# Patient Record
Sex: Female | Born: 1970 | Race: White | Hispanic: No | Smoking: Never smoker
Health system: Southern US, Community
[De-identification: ages and names within clinical notes are randomized; demographics above are authoritative.]

## PROBLEM LIST (undated history)

## (undated) ENCOUNTER — Inpatient Hospital Stay (HOSPITAL_COMMUNITY): Payer: Self-pay

## (undated) DIAGNOSIS — G40909 Epilepsy, unspecified, not intractable, without status epilepticus: Secondary | ICD-10-CM

## (undated) DIAGNOSIS — E282 Polycystic ovarian syndrome: Secondary | ICD-10-CM

## (undated) DIAGNOSIS — F419 Anxiety disorder, unspecified: Secondary | ICD-10-CM

## (undated) DIAGNOSIS — R569 Unspecified convulsions: Secondary | ICD-10-CM

## (undated) DIAGNOSIS — I10 Essential (primary) hypertension: Secondary | ICD-10-CM

---

## 2000-07-07 ENCOUNTER — Inpatient Hospital Stay (HOSPITAL_COMMUNITY): Admission: EM | Admit: 2000-07-07 | Discharge: 2000-07-11 | Payer: Self-pay | Admitting: *Deleted

## 2000-10-09 ENCOUNTER — Other Ambulatory Visit: Admission: RE | Admit: 2000-10-09 | Discharge: 2000-10-09 | Payer: Self-pay | Admitting: Gynecology

## 2000-10-09 ENCOUNTER — Encounter (INDEPENDENT_AMBULATORY_CARE_PROVIDER_SITE_OTHER): Payer: Self-pay

## 2004-07-06 ENCOUNTER — Emergency Department (HOSPITAL_COMMUNITY): Admission: EM | Admit: 2004-07-06 | Discharge: 2004-07-07 | Payer: Self-pay | Admitting: Emergency Medicine

## 2006-04-11 ENCOUNTER — Inpatient Hospital Stay (HOSPITAL_COMMUNITY): Admission: AD | Admit: 2006-04-11 | Discharge: 2006-04-11 | Payer: Self-pay | Admitting: Family Medicine

## 2006-04-13 ENCOUNTER — Inpatient Hospital Stay (HOSPITAL_COMMUNITY): Admission: AD | Admit: 2006-04-13 | Discharge: 2006-04-13 | Payer: Self-pay | Admitting: Family Medicine

## 2006-04-18 ENCOUNTER — Inpatient Hospital Stay (HOSPITAL_COMMUNITY): Admission: AD | Admit: 2006-04-18 | Discharge: 2006-04-18 | Payer: Self-pay | Admitting: Obstetrics and Gynecology

## 2006-04-25 ENCOUNTER — Inpatient Hospital Stay (HOSPITAL_COMMUNITY): Admission: RE | Admit: 2006-04-25 | Discharge: 2006-04-25 | Payer: Self-pay | Admitting: Obstetrics and Gynecology

## 2006-07-27 ENCOUNTER — Encounter: Admission: RE | Admit: 2006-07-27 | Discharge: 2006-10-25 | Payer: Self-pay | Admitting: Obstetrics and Gynecology

## 2006-08-27 ENCOUNTER — Inpatient Hospital Stay (HOSPITAL_COMMUNITY): Admission: AD | Admit: 2006-08-27 | Discharge: 2006-08-27 | Payer: Self-pay | Admitting: Obstetrics and Gynecology

## 2006-11-13 ENCOUNTER — Inpatient Hospital Stay (HOSPITAL_COMMUNITY): Admission: AD | Admit: 2006-11-13 | Discharge: 2006-11-13 | Payer: Self-pay | Admitting: Obstetrics and Gynecology

## 2006-11-14 ENCOUNTER — Inpatient Hospital Stay (HOSPITAL_COMMUNITY): Admission: AD | Admit: 2006-11-14 | Discharge: 2006-11-14 | Payer: Self-pay | Admitting: Obstetrics & Gynecology

## 2006-11-21 ENCOUNTER — Inpatient Hospital Stay (HOSPITAL_COMMUNITY): Admission: RE | Admit: 2006-11-21 | Discharge: 2006-11-27 | Payer: Self-pay | Admitting: Obstetrics and Gynecology

## 2006-11-23 ENCOUNTER — Encounter (INDEPENDENT_AMBULATORY_CARE_PROVIDER_SITE_OTHER): Payer: Self-pay | Admitting: Specialist

## 2006-11-26 ENCOUNTER — Ambulatory Visit: Payer: Self-pay | Admitting: Psychiatry

## 2008-09-29 ENCOUNTER — Inpatient Hospital Stay (HOSPITAL_COMMUNITY): Admission: AD | Admit: 2008-09-29 | Discharge: 2008-09-29 | Payer: Self-pay | Admitting: Obstetrics and Gynecology

## 2009-01-19 ENCOUNTER — Ambulatory Visit (HOSPITAL_COMMUNITY): Admission: RE | Admit: 2009-01-19 | Discharge: 2009-01-19 | Payer: Self-pay | Admitting: Obstetrics and Gynecology

## 2009-01-21 ENCOUNTER — Inpatient Hospital Stay (HOSPITAL_COMMUNITY): Admission: AD | Admit: 2009-01-21 | Discharge: 2009-01-21 | Payer: Self-pay | Admitting: Obstetrics and Gynecology

## 2009-02-20 ENCOUNTER — Ambulatory Visit (HOSPITAL_COMMUNITY): Admission: RE | Admit: 2009-02-20 | Discharge: 2009-02-20 | Payer: Self-pay | Admitting: Obstetrics and Gynecology

## 2009-03-17 ENCOUNTER — Ambulatory Visit (HOSPITAL_COMMUNITY): Admission: RE | Admit: 2009-03-17 | Discharge: 2009-03-17 | Payer: Self-pay | Admitting: Obstetrics and Gynecology

## 2009-04-06 ENCOUNTER — Encounter: Admission: RE | Admit: 2009-04-06 | Discharge: 2009-07-05 | Payer: Self-pay | Admitting: Obstetrics and Gynecology

## 2009-04-07 ENCOUNTER — Ambulatory Visit (HOSPITAL_COMMUNITY): Admission: RE | Admit: 2009-04-07 | Discharge: 2009-04-07 | Payer: Self-pay | Admitting: Obstetrics and Gynecology

## 2009-04-10 ENCOUNTER — Ambulatory Visit: Payer: Self-pay | Admitting: Obstetrics & Gynecology

## 2009-04-28 ENCOUNTER — Inpatient Hospital Stay (HOSPITAL_COMMUNITY): Admission: AD | Admit: 2009-04-28 | Discharge: 2009-04-28 | Payer: Self-pay | Admitting: Obstetrics and Gynecology

## 2009-04-30 ENCOUNTER — Ambulatory Visit (HOSPITAL_COMMUNITY): Admission: RE | Admit: 2009-04-30 | Discharge: 2009-04-30 | Payer: Self-pay | Admitting: Obstetrics and Gynecology

## 2009-05-19 ENCOUNTER — Inpatient Hospital Stay (HOSPITAL_COMMUNITY): Admission: RE | Admit: 2009-05-19 | Discharge: 2009-05-21 | Payer: Self-pay | Admitting: Obstetrics and Gynecology

## 2009-05-19 ENCOUNTER — Encounter (INDEPENDENT_AMBULATORY_CARE_PROVIDER_SITE_OTHER): Payer: Self-pay | Admitting: Obstetrics and Gynecology

## 2009-06-05 ENCOUNTER — Ambulatory Visit: Admission: RE | Admit: 2009-06-05 | Discharge: 2009-06-05 | Payer: Self-pay | Admitting: Obstetrics and Gynecology

## 2010-01-14 IMAGING — US US OB FOLLOW-UP
1 series · 14 of 28 positions shown · non-contrast
Comparison: none

OBSTETRICAL ULTRASOUND:
 This ultrasound was performed in The [HOSPITAL], and the [facility] report will be stored to [REDACTED] PACS.

[Series 1: us ob follow-up · 14 of 28 slices shown]
[im 2/28]
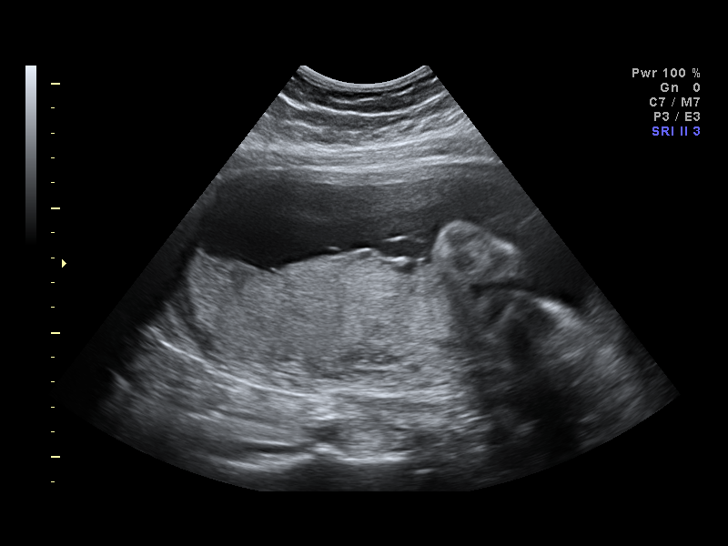
[im 4/28]
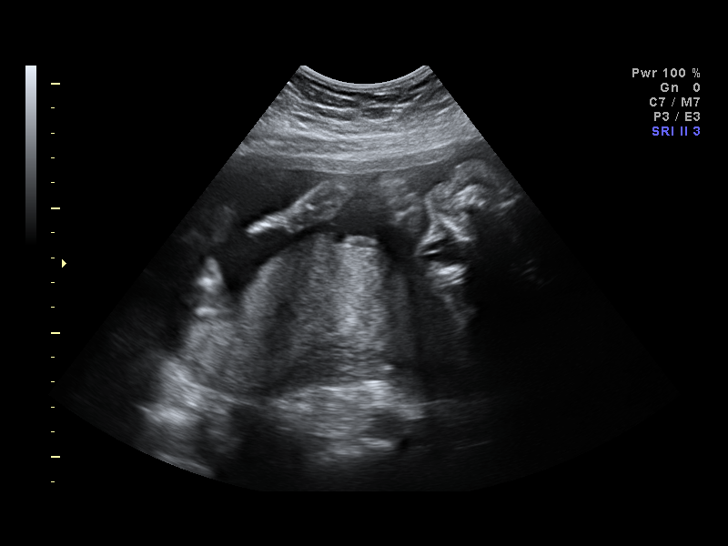
[im 6/28]
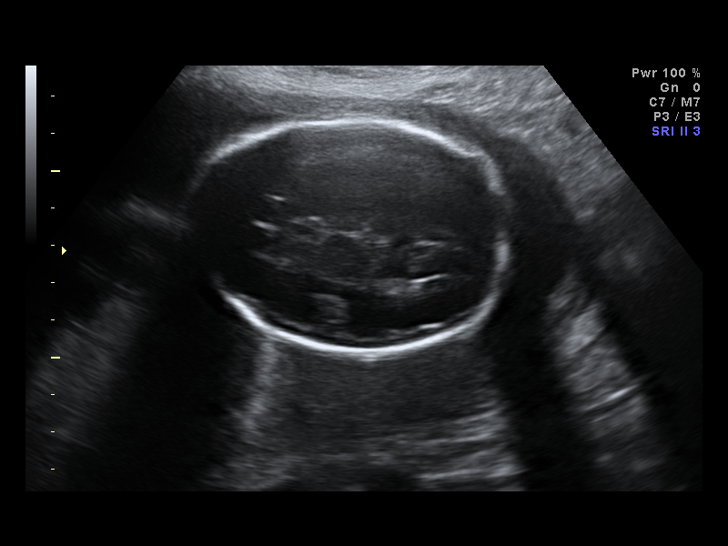
[im 8/28]
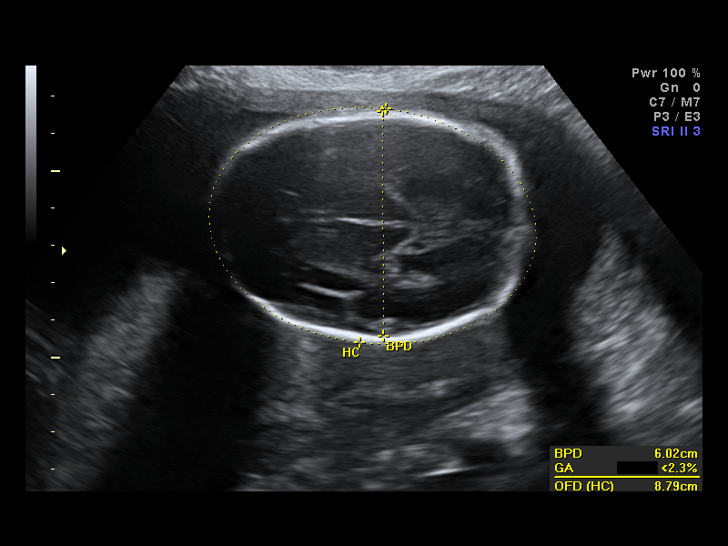
[im 10/28]
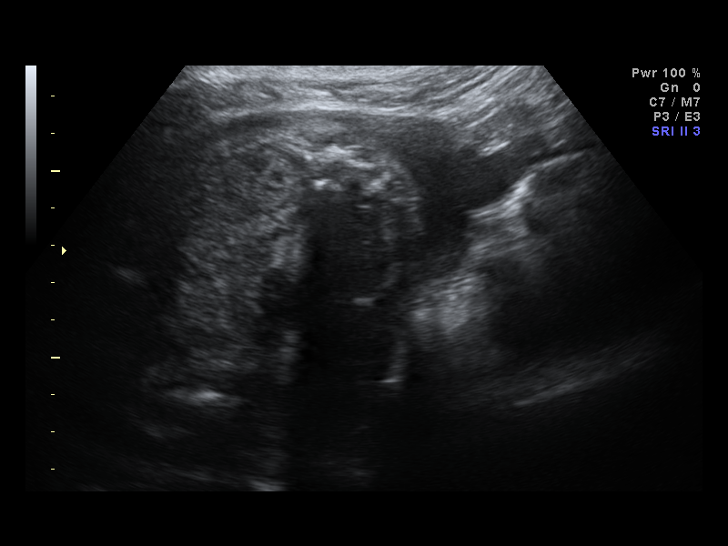
[im 12/28]
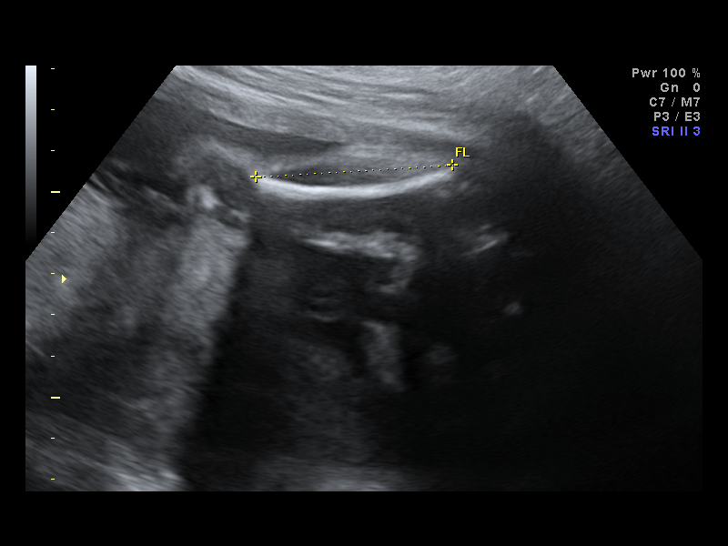
[im 14/28]
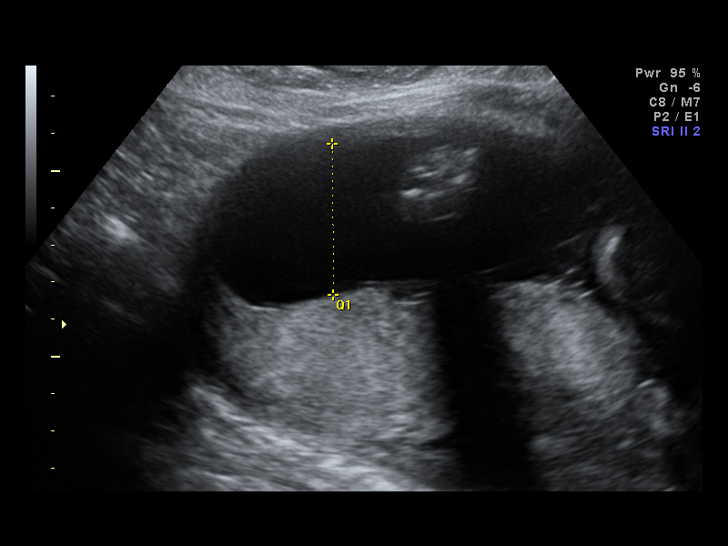
[im 16/28]
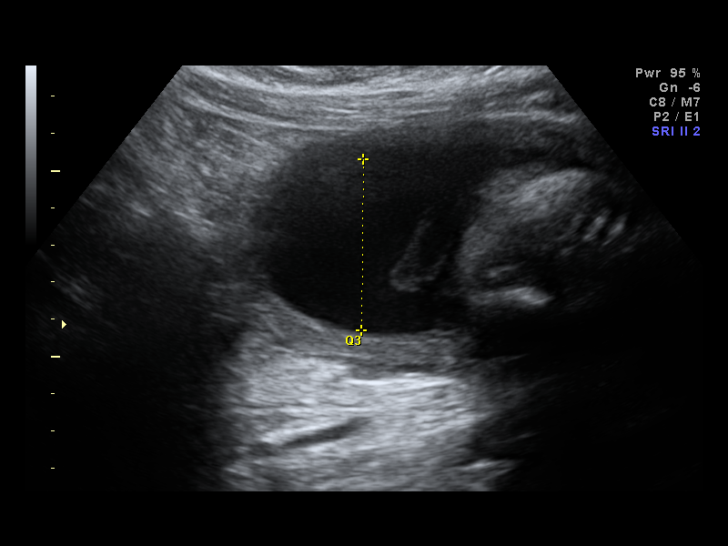
[im 18/28]
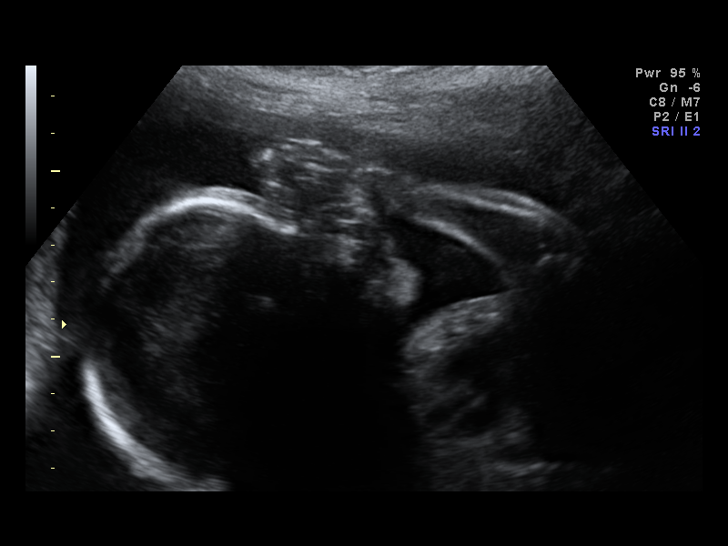
[im 20/28]
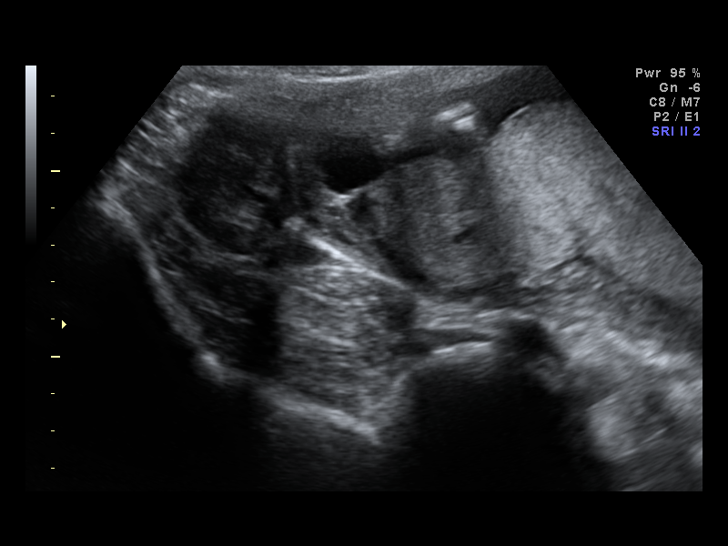
[im 22/28]
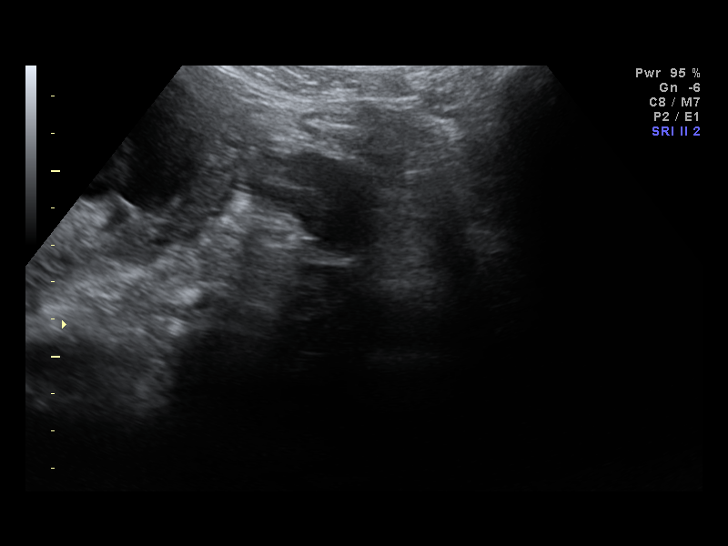
[im 24/28]
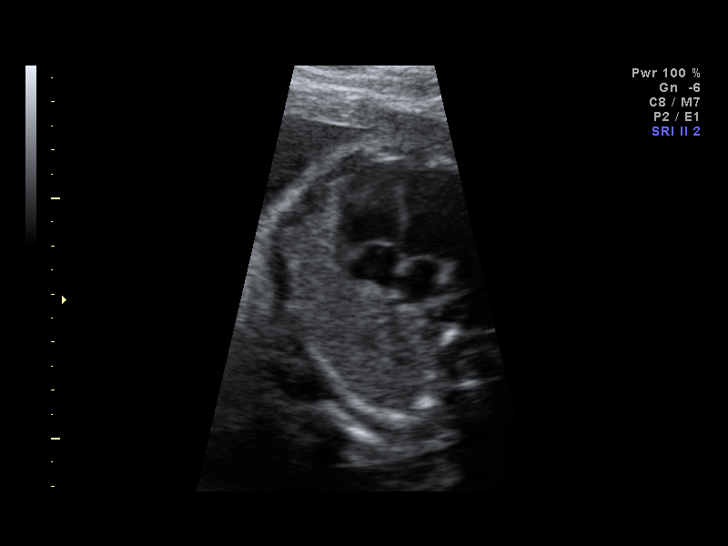
[im 26/28]
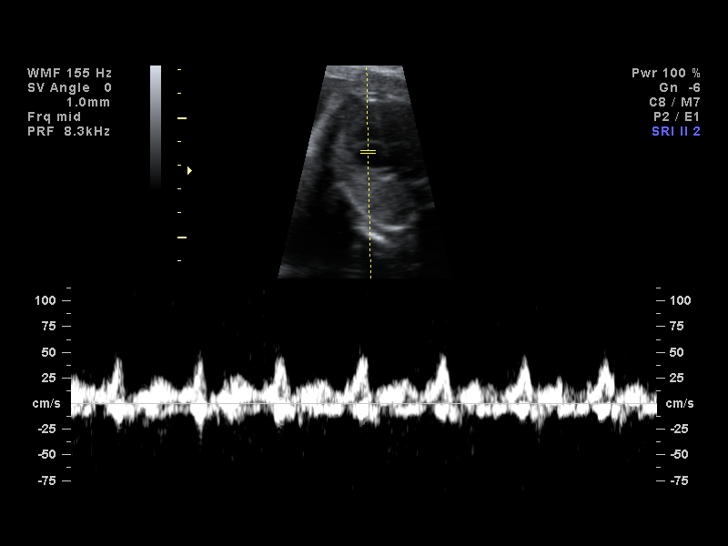
[im 28/28]
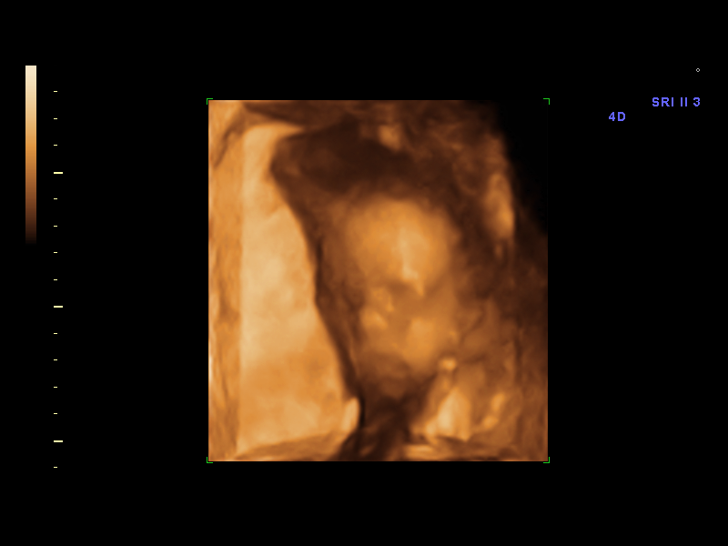

[14 of 28 positions shown; findings below may reference images not displayed]

IMPRESSION: [facility] has also been faxed to the ordering physician.

## 2010-12-17 LAB — GLUCOSE, CAPILLARY
Glucose-Capillary: 113 mg/dL — ABNORMAL HIGH (ref 70–99)
Glucose-Capillary: 113 mg/dL — ABNORMAL HIGH (ref 70–99)
Glucose-Capillary: 113 mg/dL — ABNORMAL HIGH (ref 70–99)
Glucose-Capillary: 113 mg/dL — ABNORMAL HIGH (ref 70–99)
Glucose-Capillary: 113 mg/dL — ABNORMAL HIGH (ref 70–99)
Glucose-Capillary: 113 mg/dL — ABNORMAL HIGH (ref 70–99)
Glucose-Capillary: 113 mg/dL — ABNORMAL HIGH (ref 70–99)
Glucose-Capillary: 113 mg/dL — ABNORMAL HIGH (ref 70–99)
Glucose-Capillary: 113 mg/dL — ABNORMAL HIGH (ref 70–99)
Glucose-Capillary: 113 mg/dL — ABNORMAL HIGH (ref 70–99)
Glucose-Capillary: 113 mg/dL — ABNORMAL HIGH (ref 70–99)
Glucose-Capillary: 113 mg/dL — ABNORMAL HIGH (ref 70–99)
Glucose-Capillary: 113 mg/dL — ABNORMAL HIGH (ref 70–99)
Glucose-Capillary: 113 mg/dL — ABNORMAL HIGH (ref 70–99)
Glucose-Capillary: 113 mg/dL — ABNORMAL HIGH (ref 70–99)
Glucose-Capillary: 113 mg/dL — ABNORMAL HIGH (ref 70–99)
Glucose-Capillary: 113 mg/dL — ABNORMAL HIGH (ref 70–99)
Glucose-Capillary: 113 mg/dL — ABNORMAL HIGH (ref 70–99)
Glucose-Capillary: 113 mg/dL — ABNORMAL HIGH (ref 70–99)
Glucose-Capillary: 113 mg/dL — ABNORMAL HIGH (ref 70–99)
Glucose-Capillary: 113 mg/dL — ABNORMAL HIGH (ref 70–99)
Glucose-Capillary: 113 mg/dL — ABNORMAL HIGH (ref 70–99)
Glucose-Capillary: 113 mg/dL — ABNORMAL HIGH (ref 70–99)
Glucose-Capillary: 113 mg/dL — ABNORMAL HIGH (ref 70–99)
Glucose-Capillary: 113 mg/dL — ABNORMAL HIGH (ref 70–99)
Glucose-Capillary: 113 mg/dL — ABNORMAL HIGH (ref 70–99)
Glucose-Capillary: 113 mg/dL — ABNORMAL HIGH (ref 70–99)
Glucose-Capillary: 113 mg/dL — ABNORMAL HIGH (ref 70–99)
Glucose-Capillary: 113 mg/dL — ABNORMAL HIGH (ref 70–99)
Glucose-Capillary: 113 mg/dL — ABNORMAL HIGH (ref 70–99)
Glucose-Capillary: 113 mg/dL — ABNORMAL HIGH (ref 70–99)
Glucose-Capillary: 113 mg/dL — ABNORMAL HIGH (ref 70–99)
Glucose-Capillary: 113 mg/dL — ABNORMAL HIGH (ref 70–99)
Glucose-Capillary: 113 mg/dL — ABNORMAL HIGH (ref 70–99)
Glucose-Capillary: 113 mg/dL — ABNORMAL HIGH (ref 70–99)
Glucose-Capillary: 113 mg/dL — ABNORMAL HIGH (ref 70–99)
Glucose-Capillary: 113 mg/dL — ABNORMAL HIGH (ref 70–99)
Glucose-Capillary: 113 mg/dL — ABNORMAL HIGH (ref 70–99)
Glucose-Capillary: 113 mg/dL — ABNORMAL HIGH (ref 70–99)
Glucose-Capillary: 113 mg/dL — ABNORMAL HIGH (ref 70–99)
Glucose-Capillary: 113 mg/dL — ABNORMAL HIGH (ref 70–99)
Glucose-Capillary: 113 mg/dL — ABNORMAL HIGH (ref 70–99)
Glucose-Capillary: 113 mg/dL — ABNORMAL HIGH (ref 70–99)
Glucose-Capillary: 113 mg/dL — ABNORMAL HIGH (ref 70–99)
Glucose-Capillary: 113 mg/dL — ABNORMAL HIGH (ref 70–99)
Glucose-Capillary: 113 mg/dL — ABNORMAL HIGH (ref 70–99)
Glucose-Capillary: 113 mg/dL — ABNORMAL HIGH (ref 70–99)
Glucose-Capillary: 113 mg/dL — ABNORMAL HIGH (ref 70–99)
Glucose-Capillary: 113 mg/dL — ABNORMAL HIGH (ref 70–99)
Glucose-Capillary: 113 mg/dL — ABNORMAL HIGH (ref 70–99)
Glucose-Capillary: 113 mg/dL — ABNORMAL HIGH (ref 70–99)
Glucose-Capillary: 113 mg/dL — ABNORMAL HIGH (ref 70–99)
Glucose-Capillary: 113 mg/dL — ABNORMAL HIGH (ref 70–99)
Glucose-Capillary: 113 mg/dL — ABNORMAL HIGH (ref 70–99)
Glucose-Capillary: 113 mg/dL — ABNORMAL HIGH (ref 70–99)
Glucose-Capillary: 113 mg/dL — ABNORMAL HIGH (ref 70–99)
Glucose-Capillary: 113 mg/dL — ABNORMAL HIGH (ref 70–99)
Glucose-Capillary: 113 mg/dL — ABNORMAL HIGH (ref 70–99)
Glucose-Capillary: 113 mg/dL — ABNORMAL HIGH (ref 70–99)
Glucose-Capillary: 113 mg/dL — ABNORMAL HIGH (ref 70–99)
Glucose-Capillary: 113 mg/dL — ABNORMAL HIGH (ref 70–99)
Glucose-Capillary: 113 mg/dL — ABNORMAL HIGH (ref 70–99)
Glucose-Capillary: 113 mg/dL — ABNORMAL HIGH (ref 70–99)
Glucose-Capillary: 113 mg/dL — ABNORMAL HIGH (ref 70–99)
Glucose-Capillary: 64 mg/dL — ABNORMAL LOW (ref 70–99)
Glucose-Capillary: 68 mg/dL — ABNORMAL LOW (ref 70–99)
Glucose-Capillary: 70 mg/dL (ref 70–99)
Glucose-Capillary: 85 mg/dL (ref 70–99)
Glucose-Capillary: 88 mg/dL (ref 70–99)

## 2010-12-17 LAB — CBC
HCT: 29.6 % — ABNORMAL LOW (ref 36.0–46.0)
Hemoglobin: 10.4 g/dL — ABNORMAL LOW (ref 12.0–15.0)
MCHC: 35 g/dL (ref 30.0–36.0)
MCV: 92.4 fL (ref 78.0–100.0)
Platelets: 113 10*3/uL — ABNORMAL LOW (ref 150–400)
RBC: 3.21 MIL/uL — ABNORMAL LOW (ref 3.87–5.11)
RDW: 13.1 % (ref 11.5–15.5)
WBC: 7.9 10*3/uL (ref 4.0–10.5)

## 2010-12-17 LAB — CCBB MATERNAL DONOR DRAW

## 2010-12-18 LAB — CBC
MCHC: 34.1 g/dL (ref 30.0–36.0)
Platelets: 172 10*3/uL (ref 150–400)
RDW: 13.4 % (ref 11.5–15.5)

## 2010-12-18 LAB — BASIC METABOLIC PANEL
BUN: 9 mg/dL (ref 6–23)
CO2: 25 mEq/L (ref 19–32)
Calcium: 9 mg/dL (ref 8.4–10.5)
Creatinine, Ser: 1 mg/dL (ref 0.4–1.2)
GFR calc non Af Amer: >60 mL/min (ref >60)
Glucose, Bld: 99 mg/dL (ref 70–99)

## 2010-12-18 LAB — URINE CULTURE: Colony Count: 4000

## 2010-12-18 LAB — RPR: RPR Ser Ql: NONREACTIVE

## 2010-12-18 LAB — URINALYSIS, ROUTINE W REFLEX MICROSCOPIC
Glucose, UA: NEGATIVE mg/dL
Hgb urine dipstick: NEGATIVE
Ketones, ur: 15 mg/dL — AB
Protein, ur: NEGATIVE mg/dL
Urobilinogen, UA: 1 mg/dL (ref 0.0–1.0)

## 2010-12-18 LAB — URINE MICROSCOPIC-ADD ON

## 2010-12-27 LAB — POCT PREGNANCY, URINE: Preg Test, Ur: POSITIVE

## 2011-01-25 NOTE — Letter (Signed)
Jan 19, 2009    Guy Sandifer. Henderson Cloud, MD  [address]  [place] [address]   RE:   SHALOM, WARE  MR#   [id number]  ACC#        [id number]   MFM CONSULTATION REPORT   Dear Dr. Henderson Cloud:   I first want to thank you for referring your patient Kathleen Blake  to me for a perinatal consult.  As you know she is a 40 year old gravida  3, para 1, who presents with multiple high risk factors.  Her current  EDC is May 24, 2009, which places her at 53 and 6/7th weeks today.  Her pregnancy is complicated by the following:  1.  Maternal history of epilepsy.  2.  Chronic hypertension.  3.  Previous cesarean section.  4.  History of gestational diabetes.  5.  Advanced maternal age.   I will address perinatal management of each of these high risk factors  as outlined below.  1.  History of seizure disorder:  The patient reports a history of  epilepsy dating back to the age of 58.  At which time, she was diagnosed  with grand mal seizures.  She initially was placed on phenobarbital, but  has been on Tegretol since approximately 1991.  The patient reports no  significant grand mal seizures since 2003, but does report a petit mal  seizure approximately every 4 months.  She was followed throughout her  last pregnancy, at which time she delivered in 2008 on Tegretol.  Finally she reports the seizure disorder is secondary to a head injury  which she suffered as a child.  I discussed with her that the majority  of women will not experience an increase in seizure frequency during the  pregnancy and that the perineal outcomes in general are quite good with  epilepsy-complicating pregnancy.  To the fact that she has not had a  grand mal seizures in 2003 and did not report increased seizure  frequency during her last pregnancy, I would not anticipate that would  be a major problem with this pregnancy.  I did discuss that there is  approximately 1% risk of spina bifida with  Tegretol use.  In addition,  there have been some reports of genitourinary malformations in infants  born to moms' on Tegretol monotherapy.  We performed a complete anatomic  survey today and there was no evidence of spina bifida or renal  abnormalities noted.  In addition, the fetal growth was noted to be  normal.  This ultrasound report will come as a separate report to you,  separate from the consultation letter.  It is also of note is that her  AFP test was noted to be normal during the pregnancy.  2.  Chronic hypertension, on labetalol.  The patient reports that she  has been diagnosed with chronic hypertension, but mainly due to when she  has been significantly overweight and during her pregnancy.  Of note is  that her blood pressure on today's visit was 140/101 with a few recheck  of 140/92.  She reports that she forgot to take her labetalol today and  this does appear to be somewhat noncompliant with her labetalol therapy.  Due to her history of chronic hypertension, I would recommend a baseline  24-hour urine for protein, however, we will also need serial scans for  growth in the initiation of NST starting at 32 weeks' gestation.  3.  History of previous cesarean section.  The  patient states that you  have already discussed with her attempt of VBAC and she states that she  is interested in attempting a VBAC in this pregnancy.  4.  History of gestational diabetes.  Due to the patient's history of  gestational diabetes in her previous pregnancy and her relative obesity  (weight 211 pounds), I would recommend an early Glucola screen in this  pregnancy.  If this Glucola screen is normal, then I would recommend  repeating it at 27-28 weeks' gestation.  If that screen is also within  normal limits, then I would accept that she does not have gestational  diabetes during this pregnancy.  5.  Advanced maternal age.  Of note is that the patient had a normal  first trimester screen during  this pregnancy.  I did discuss the  increased risk of karyotype abnormalities.  However, the patient again  reaffirmed that she is not interested in any kind of definitive testing.  Although some recommend NSTs, so with her advanced maternal age it is  49.  I do not think these are necessary.  However, based upon her  chronic hypertension, she will be obtaining NSTs starting at 32 weeks'  gestation.   I again want to thank you for referring your patient, Kathleen Blake  to me for a perinatal consultation, although I am aware that the main  quantitative question was the use of Tegretol during the pregnancy.  I  did address with her, her other risk factors.  If you have any questions  or concerns concerning my recommendations, please feel free to contact  me.   Sincerely,      Felipa Eth, MD      DCM/MEDQ  D:  01/19/2009  T:  01/20/2009  Job:  [id number]

## 2011-01-28 NOTE — Consult Note (Signed)
[name], [name]           ACCOUNT NO.:  [id number]   MEDICAL RECORD NO.:  [id number]          PATIENT TYPE:  INP   LOCATION:  9105                          FACILITY:  [facility]   PHYSICIAN:  Antonietta Breach, M.D.  DATE OF BIRTH:  28-Aug-1971   DATE OF CONSULTATION:  11/27/2006  DATE OF DISCHARGE:  11/27/2006                                 CONSULTATION   REASON FOR CONSULTATION:  Depression.   REQUESTING PHYSICIAN:  Guy Sandifer. Henderson Cloud, M.D.   HISTORY OF PRESENT ILLNESS:  Kathleen Blake is a 40 year old female  admitted to the [facility] on November 21, 2006.  She  underwent a low transverse cesarean section on November 23, 2006, due to  failed induction.  She also was experiencing gestational diabetes as  well as chronic hypertension.   The baby did require a period of time in the ICU and this news was very  distressing to the patient.   She was crying and expressing some depressed mood.  She was not having  any suicidal thoughts.  She also has had no racing thoughts or increased  energy.  She has had no thoughts of harming others, hallucinations or  delusions.   The patient has had depressed mood during pregnancy with decreased  energy.  She has solid hope today after getting news of a positive  prognosis for her baby and a likely discharge of her baby from the  hospital this coming week.  The patient is socially appropriate and  cooperative with care.  She is exhibiting good judgment.  She is not  agitated or combative.   PAST PSYCHIATRIC HISTORY:  Kathleen Blake describes history of manic  symptoms, the last episode being in 2006.  She did have a number of days  of increased energy, decreased need for sleep, racing thoughts,  pressured speech, and excessive spending.  She has never made any  suicide attempts.  She does have a history of depression.   She has had an excellent response in mood stabilization with Tegretol.  The dosage has only been required at  200 mg a day, which also has been  successful for her antiseizure treatment.  This dosage has given her an  adequate antiseizure blood level.   She denies history of hallucinations or delusions.   FAMILY PSYCHIATRIC HISTORY:  None known.   SOCIAL HISTORY:  She is divorced but has a very supportive boyfriend.  She does not use any alcohol or illegal drugs.  Currently, she is  unemployed.   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   GENERAL MEDICAL PROBLEMS:  1. Hypertension.  2. Gestational diabetes.  3. Status post cesarean section.   MEDICATIONS:  The MAR is reviewed.  The patient has currently been  started on Zoloft 25 mg q.a.m.  Her Tegretol is at 100 mg daily.  She  has allergies to [facility], specifically SHRIMP.   LABORATORY DATA:  WBC 7.8, hemoglobin 10.1, platelet count 163.  Metabolic panel:  BUN 9, creatinine 1.0, SGOT 19, SGPT 14, albumin is  1.9, calcium 8.3.  Her Tegretol level was low at 3.2.  RPR was  nonreactive.  REVIEW OF SYSTEMS:  CONSTITUTIONAL:  Afebrile.  HEAD:  No trauma.  EYES:  No visual changes.  EARS:  No hearing impairment.  NOSE:  No rhinorrhea.  MOUTH/THROAT:  No sore throat.  NEUROLOGIC:  Unremarkable.  There are no  current seizures.  She does have a history of seizures, however.  PSYCHIATRIC:  As above.  CARDIOVASCULAR:  No chest pain, palpitations.  RESPIRATORY:  No coughing or wheezing.  GASTROINTESTINAL:  No nausea or  vomiting.  GENITOURINARY:  No dysuria.  SKIN:  Unremarkable.  HEMATOLOGIC/LYMPHATIC:  Unremarkable.  MUSCULOSKELETAL:  Unremarkable.  ENDOCRINE/METABOLIC:  As above.   EXAMINATION:  Vital signs:  Temperature 97.7, pulse 77, respirations 18,  blood pressure 108/72.   MENTAL STATUS EXAMINATION:  Kathleen Blake is an alert female appearing  her chronologic age, sitting up in a hospital chair with normal posture  and normal psychomotor tone.  Her eye contact is good.  She is socially  appropriate and cooperative.  She is oriented to all spheres.   Her  memory is intact to immediate, recent and remote.  Her speech involves  rate and prosody.  Her fund of knowledge and intelligence are within  normal limits.  Thought process logical, coherent, goal-directed.  No  looseness of association.  Thought content:  No thoughts of harming  herself, no thought of harming others.  No delusions, no hallucinations.  Concentration within normal limits.  Affect is slightly constricted at  baseline but then with a broad and appropriate range as the interview  progresses.  Her mood is within normal limits.  Insight is good,  judgment is intact.  She is well groomed.  The patient has not had any  thoughts of harming her baby.   ASSESSMENT:  AXIS I:  1. Code 293.83, mood disorder not otherwise specified (functional and      general medical factors).  2. Code 296.80, bipolar disorder not otherwise specified.  The patient      does have a history of typical manic episodes and is currently in a      partial remission of a depressive phase.  AXIS II:  Deferred.  AXIS III:  See general medical problems.  AXIS IV:  General medical.  AXIS V:  55.   Kathleen Blake is not at risk to harm herself or others.  She agrees to  use emergency services immediately for any thoughts of harming herself,  thoughts of harming others, thoughts of harming the baby, or other  emergency psychiatric symptoms.   The undersigned provided education and ego support.  The indications,  alternatives and adverse effects of the following were discussed with  the patient:  Tegretol for mood stabilization as well as antiseizure  including the risk of potentially lethal liver and blood effect; Zoloft  for antidepressant; the discussion also included the fact that there  could be unknown harmful effects to the baby through breast milk as well  as the fact that there is relatively low data in the literature on the relationship between breast-feeding women on these medications and   effects for the baby.  The patient understands this above information  and wants to proceed with Tegretol and Zoloft.  She also is motivated  for outpatient psychiatric management.   RECOMMENDATIONS:  1. Would increase her Tegretol to 200 mg q.h.s. and repeat a blood      level in 7-10 days.  Would check a CBC and liver function panel to      monitor for  Tegretol side effects.  2. Concur with the Zoloft 25 mg q.a.m., increasing to 50 mg q.a.m. as      tolerated.  3. Would ask the case manager to set this patient with psychiatric      followup at one of the psychiatric clinics attached to [facility]      or [facility].  4. Would ask that the OB/GYN outpatient physician initially monitor      the Tegretol as described above.  However, in addition to      psychiatric followup, would ask the case manager to set this      patient up with neurologic followup as well.      Antonietta Breach, M.D.  Electronically Signed     JW/MEDQ  D:  11/30/2006  T:  11/30/2006  Job:  [id number]

## 2011-01-28 NOTE — Discharge Summary (Signed)
Kathleen Blake, Kathleen Blake           ACCOUNT NO.:  [id number]   MEDICAL RECORD NO.:  [id number]          PATIENT TYPE:  INP   LOCATION:  9105                          [facility]ACILITY:  [facility]   PHYSICIAN:  Dineen Kid. Rana Snare, M.D.    DATE OF BIRTH:  1971-01-02   DATE OF ADMISSION:  11/21/2006  DATE OF DISCHARGE:  11/27/2006                               DISCHARGE SUMMARY   ADMITTING DIAGNOSES:  1. Intrauterine pregnancy at 38-3/7 weeks' estimated gestational age.  2. Chronic hypertension.  3. History of bipolar and seizure disorder.  4. Gestational diabetes.  5. Induction of labor.   DISCHARGE DIAGNOSES:  1. Status post low transverse cesarean section secondary to failed      induction.  2. Viable female infant.   PROCEDURE:  Primary low transverse cesarean section.   REASON FOR ADMISSION:  Please see written H&P.   HOSPITAL COURSE:  The patient is a 40 year old white married female  gravida 2, para 0 that was admitted to [facility] at 75-  3/7 weeks estimated gestational age for induction of labor.  The  patient's pregnancy had been complicated by chronic hypertension,  history of bipolar and seizure disorder, and gestational diabetes.  The  patient had been on Glyburide with good control of her sugars.  The  patient had undergone an amniocentesis 7-9 days prior to admission for  lung maturity which had revealed an L/S ratio of 1.3 to 1, negative for  PG.  The patient was now admitted for induction of labor.   MEDICATIONS ON ADMISSION:  1. Labetalol 200 mg daily.  2. Tegretol 100 mg daily.   Vital signs were normal.  She was afebrile.  Cervix was noted to be  closed, thick with vertex high in the pelvis.  Fetal heart tones were  reactive.  Uterine contraction pattern revealed some uterine  irritability.  PIH labs were drawn which were within normal limits.  The  patient was now set for a two-stage induction of labor.  On the  following day after Kathleen Blake day administration of  Pitocin, there had been no  further change in the cervix, and decision was made to consider a repeat  induction or a cesarean delivery.  The patient and husband had decided  to proceed with a primary low transverse cesarean section.  The patient  was then transferred to the operating room where spinal anesthesia was  administered without difficulty.  A low transverse incision was made  with delivery of a viable female infant weighing 7 pounds 1 ounce, Apgars  of 8 at one minute and 9 at five minutes.  The patient tolerated the  procedure well and was taken to the recovery room in stable condition.  On postoperative day #1, the patient denied headache, blurred vision or  right upper quadrant pain.  Vital signs were stable.  Blood pressure was  139/87 to 121/76.  Deep tendon reflexes 1 to 2 plus without clonus, and  small pedal edema was noted.  The patient denied any right upper  quadrant tenderness.  Abdomen soft with some decrease in bowel sounds.  Fundus was firm and  nontender.  Abdominal dressing was noted to be  clean, dry and intact.  Foley had been discontinued.  She had not voided  at the time of rounding.  Fasting blood sugar was 82 mg/dL. Laboratory  findings revealed hemoglobin of 11.4, platelet count 160,000, WBC count  of 9.7.  PIH labs were ordered for in the morning.  Baby was noted to be  stable in the NICU on nasal cannula.  On postoperative day #2, the  patient was feeling somewhat better.  She denied any CNS symptoms.  Vital signs were stable with blood pressure 141/98 to 126/91.  Lungs  were clear to auscultation.  Heart regular rate and rhythm.  Abdomen  soft, good return of bowel function.  No epigastric tenderness noted.  Abdominal dressing had been removed revealing an incision that was  clean, dry and intact.  PIH labs were within normal limits.  On  postoperative day #3, the patient was concerned about her baby.  The  patient did have history of bipolar disorder in  the past.  She denied  any homicidal or suicidal ideation.  Vital signs were stable.  Incision  was clean, dry and intact.  She continued on Tegretol, and psych consult  was ordered.  On postoperative day #4, the patient was complaining of  some tearfulness with plans for discharge.  Baby continued to be stable  in the NICU with possible discharge in approximately 2 days on baby.  Vital signs were stable; blood pressure 143/94 to 105/63.  Deep tendon  reflexes of 1+, no clonus.  Pedal edema was noted.  Abdomen soft, no  right upper quadrant tenderness.  Fundus firm and nontender.  Incision  was clean, dry and intact.  She was ambulating well.  Tegretol level was  drawn, and the patient was started on some Zoloft.  Discharge  instructions reviewed, and the patient was later discharged home.   CONDITION ON DISCHARGE:  Stable.   DIET:  Regular as tolerated.   ACTIVITY:  No heavy lifting, no driving x2 weeks, no vaginal entry.   FOLLOW UP:  Patient to follow up in the OB office in approximately 1  week for an incision check and blood pressure check.  She was also to  follow up with psychiatric consult in approximately 2 weeks.   DISCHARGE MEDICATIONS:  1. Percocet 5/325 #30 one p.o. every 4 hours as needed for pain.  2. Motrin 600 mg every 6 hours as needed.  3. Tegretol 200 mg at bedtime.  4. CitraNatal prenatal vitamins.  5. Labetalol 200 mg 1 p.o. daily.  6. Zoloft 50 mg half a tablet 1 p.o. daily for 1 week and then      increase to 50 mg daily.      Julio Sicks, N.P.      Dineen Kid Rana Snare, M.D.  Electronically Signed    CC/MEDQ  D:  12/20/2006  T:  12/20/2006  Job:  [id number]

## 2011-01-28 NOTE — Op Note (Signed)
Kathleen Blake, Kathleen Blake           ACCOUNT NO.:  [id number]   MEDICAL RECORD NO.:  [id number]          PATIENT TYPE:  INP   LOCATION:  9105                          FACILITY:  [facility]   PHYSICIAN:  Duke Salvia. Marcelle Overlie, M.D.DATE OF BIRTH:  1971/02/24   DATE OF PROCEDURE:  11/23/2006  DATE OF DISCHARGE:                               OPERATIVE REPORT   PREOPERATIVE DIAGNOSES:  1. Term intrauterine pregnancy.  2. Chronic hypertension.  3. Mature laceration.  4. Failed induction.  5. Gestational diabetes.   POSTOPERATIVE DIAGNOSIS:  1. Term intrauterine pregnancy.  2. Chronic hypertension.  3. Mature laceration.  4. Failed induction.  5. Gestational diabetes.   PROCEDURE:  Primary low transverse cesarean section.   SURGEON:  Duke Salvia. Marcelle Overlie, M.D.   ANESTHESIA:  Spinal.   COMPLICATIONS:  None.   DRAINS:  Foley catheter.   BLOOD LOSS:  750 mL.   PROCEDURE AND FINDINGS:  The patient was taken to the operating room.  After an adequate level of spinal anesthetic was obtained with the  patient in the left tilt position, the abdomen was prepped and draped in  the usual manner for sterile abdominal procedures.  Foley catheter  positioned, draining clear urine.  A Pfannenstiel incision made two  fingerbreadths above the symphysis, carried down to the fascia -- which  was incised and extended transversely.  Rectus muscle divided in the  midline.  Peritoneum entered superiorly without incident and extended in  a vertical fashion.  The vesicouterine serosa was then incised, and the  bladder was bluntly and sharply dissected off of the lower uterine  segment.  The bladder blade was positioned.  A transverse incision was  made in the lower segment, which was moderately thin; extended with  blunt dissection.  Clear fluid noted.  The patient then delivered of a  healthy female; Apgars 9 and 9.  A nuchal cord x1 in the transverse  presentation.  The infant was suctioned, cord clamped and  passed to  pediatric team for further care.  The placenta was delivered manually  intact.  The uterus was exteriorized.  The cavity was wiped clean with a  laparotomy pack.  Closure was obtained with the first layer of 0 chromic  in a locked fashion, followed by an imbricating layer of 0 chromic.  Tubes and ovaries were inspected and noted to be normal.  The bladder  flap area was intact and hemostatic.  Prior to closure, the sponge,  needle and instrument count was reported as correct x2.  Peritoneum  closed with a running 2-0 Vicryl suture.  Rectus muscles closed with  interrupted 2-0 Vicryl sutures.  Fascia was closed from laterally to  midline on either side with a 0 PDS suture.  The subcutaneous fat was  hemostatic.  Clips and Steri-Strips were used on the skin.  A sterile  dressing was applied.  Clear urine noted at the end of the case.  She  did receive Ancef 1 gram IV after the cord was clamped, along with  Pitocin IV.      Richard M. Marcelle Overlie, M.D.  Electronically Signed  RMH/MEDQ  D:  11/23/2006  T:  11/23/2006  Job:  [id number]

## 2011-04-20 ENCOUNTER — Other Ambulatory Visit: Payer: Self-pay | Admitting: Family Medicine

## 2011-04-20 DIAGNOSIS — Z1231 Encounter for screening mammogram for malignant neoplasm of breast: Secondary | ICD-10-CM

## 2011-05-12 ENCOUNTER — Ambulatory Visit: Payer: Self-pay | Admitting: *Deleted

## 2011-05-13 ENCOUNTER — Ambulatory Visit: Payer: Self-pay

## 2011-05-14 ENCOUNTER — Encounter: Payer: Self-pay | Admitting: *Deleted

## 2011-05-27 ENCOUNTER — Ambulatory Visit: Payer: Self-pay

## 2011-06-10 ENCOUNTER — Ambulatory Visit: Payer: Self-pay

## 2012-01-05 ENCOUNTER — Inpatient Hospital Stay (HOSPITAL_COMMUNITY): Payer: Medicaid Other

## 2012-01-05 ENCOUNTER — Encounter (HOSPITAL_COMMUNITY): Payer: Self-pay

## 2012-01-05 ENCOUNTER — Inpatient Hospital Stay (HOSPITAL_COMMUNITY)
Admission: AD | Admit: 2012-01-05 | Discharge: 2012-01-05 | Disposition: A | Discharge status: Home / Self Care | Payer: Medicaid Other | Source: Ambulatory Visit | Attending: Obstetrics and Gynecology | Admitting: Obstetrics and Gynecology

## 2012-01-05 DIAGNOSIS — Z3490 Encounter for supervision of normal pregnancy, unspecified, unspecified trimester: Secondary | ICD-10-CM

## 2012-01-05 DIAGNOSIS — O209 Hemorrhage in early pregnancy, unspecified: Secondary | ICD-10-CM | POA: Insufficient documentation

## 2012-01-05 DIAGNOSIS — Z1389 Encounter for screening for other disorder: Secondary | ICD-10-CM

## 2012-01-05 HISTORY — DX: Unspecified convulsions: R56.9

## 2012-01-05 LAB — URINALYSIS, ROUTINE W REFLEX MICROSCOPIC
Ketones, ur: NEGATIVE mg/dL
Leukocytes, UA: NEGATIVE
Nitrite: NEGATIVE
Urobilinogen, UA: 0.2 mg/dL (ref 0.0–1.0)
pH: 6 (ref 5.0–8.0)

## 2012-01-05 LAB — HCG, QUANTITATIVE, PREGNANCY: hCG, Beta Chain, Quant, S: 49940 m[IU]/mL — ABNORMAL HIGH (ref <5)

## 2012-01-05 LAB — DIFFERENTIAL
Basophils Absolute: 0 10*3/uL (ref 0.0–0.1)
Lymphocytes Relative: 32 % (ref 12–46)
Lymphs Abs: 3 10*3/uL (ref 0.7–4.0)
Neutro Abs: 5.6 10*3/uL (ref 1.7–7.7)
Neutrophils Relative %: 59 % (ref 43–77)

## 2012-01-05 LAB — CBC
MCV: 87.8 fL (ref 78.0–100.0)
Platelets: 199 10*3/uL (ref 150–400)
RBC: 4.26 MIL/uL (ref 3.87–5.11)
WBC: 9.4 10*3/uL (ref 4.0–10.5)

## 2012-01-05 LAB — WET PREP, GENITAL
Clue Cells Wet Prep HPF POC: NONE SEEN
Trich, Wet Prep: NONE SEEN

## 2012-01-05 LAB — ABO/RH: ABO/RH(D): O POS

## 2012-01-05 LAB — URINE MICROSCOPIC-ADD ON

## 2012-01-05 NOTE — MAU Provider Note (Signed)
History     CSN: [id number]  Arrival date & time 01/05/12  1254   None     Chief Complaint  Patient presents with  . Brown spotting    HPI Kathleen Blake is a 41 y.o. female who presents to MAU for bleeding in early pregnancy. Onset of symptoms yesterday with spotting. Frequent urination and nausea. Lower abdominal pain that goes from sharp to dull. LMP 11/21/11. (6.[redacted] weeks gestation) she is concerned due to her age and history of previous SAB and high risk due to seizure history. The history was provided by the patient.  Past Medical History  Diagnosis Date  . Seizures     Past Surgical History  Procedure Date  . Cesarean section     Family History  Problem Relation Age of Onset  . Heart disease Mother   . Cancer Mother   . Diabetes Father   . Birth defects Maternal Aunt   . Heart disease Maternal Grandmother   . Heart disease Maternal Grandfather   . Diabetes Paternal Grandmother     History  Substance Use Topics  . Smoking status: Never Smoker   . Smokeless tobacco: Not on file  . Alcohol Use: No    OB History    Grav Para Term Preterm Abortions TAB SAB Ect Mult Living   4 3 2  1  1   2       Review of Systems  Constitutional: Positive for fatigue. Negative for fever, chills and diaphoresis.  HENT: Negative for ear pain, congestion, sore throat, facial swelling, neck pain, neck stiffness, dental problem and sinus pressure.   Eyes: Negative for photophobia, pain and discharge.  Respiratory: Negative for cough, chest tightness and wheezing.   Cardiovascular: Negative for chest pain, palpitations and leg swelling.  Gastrointestinal: Positive for nausea, abdominal pain and constipation. Negative for vomiting, diarrhea and abdominal distention.  Genitourinary: Positive for frequency, vaginal bleeding and vaginal discharge. Negative for dysuria, flank pain and difficulty urinating.  Musculoskeletal: Negative for myalgias, back pain and gait problem.  Skin:  Negative for color change and rash.  Neurological: Positive for dizziness and light-headedness. Negative for speech difficulty, weakness, numbness and headaches.  Psychiatric/Behavioral: Negative for confusion and agitation. The patient is nervous/anxious.     Allergies  Review of patient's allergies indicates no known allergies.  Home Medications  No current outpatient prescriptions on file.  BP 152/88  Pulse 74  Temp(Src) 99.1 F (37.3 C) (Oral)  Resp 16  Ht 5' 8.25" (1.734 m)  Wt 201 lb 8 oz (91.4 kg)  BMI 30.41 kg/m2  SpO2 99%  LMP 11/21/2011  Breastfeeding? Unknown  Physical Exam  Nursing note and vitals reviewed. Constitutional: She is oriented to person, place, and time. She appears well-developed and well-nourished. No distress.  HENT:  Head: Normocephalic.  Eyes: EOM are normal.  Neck: Neck supple.  Cardiovascular: Normal rate.   Pulmonary/Chest: Effort normal.  Abdominal: Soft. There is tenderness.       Mildly tender lower abdomen with palpation.  Genitourinary:       External genitalia without lesions. Scant dark blood vaginal vault. Cervix long, closed. No CMT, no adnexal tenderness or mass palpated. Uterus slightly enlarged.    Musculoskeletal: Normal range of motion.  Neurological: She is alert and oriented to person, place, and time. No cranial nerve deficit.  Skin: Skin is warm and dry.  Psychiatric: She has a normal mood and affect. Her behavior is normal. Judgment and thought content normal.  Results for orders placed during the hospital encounter of 01/05/12 (from the past 24 hour(s))  URINALYSIS, ROUTINE W REFLEX MICROSCOPIC     Status: Abnormal   Collection Time   01/05/12  2:07 PM      Component Value Range   Color, Urine YELLOW  YELLOW    APPearance CLEAR  CLEAR    Specific Gravity, Urine >1.030 (*) 1.005 - 1.030    pH 6.0  5.0 - 8.0    Glucose, UA NEGATIVE  NEGATIVE (mg/dL)   Hgb urine dipstick SMALL (*) NEGATIVE    Bilirubin Urine NEGATIVE   NEGATIVE    Ketones, ur NEGATIVE  NEGATIVE (mg/dL)   Protein, ur NEGATIVE  NEGATIVE (mg/dL)   Urobilinogen, UA 0.2  0.0 - 1.0 (mg/dL)   Nitrite NEGATIVE  NEGATIVE    Leukocytes, UA NEGATIVE  NEGATIVE   URINE MICROSCOPIC-ADD ON     Status: Abnormal   Collection Time   01/05/12  2:07 PM      Component Value Range   Squamous Epithelial / LPF FEW (*) RARE    RBC / HPF 0-2  <3 (RBC/hpf)  POCT PREGNANCY, URINE     Status: Abnormal   Collection Time   01/05/12  2:16 PM      Component Value Range   Preg Test, Ur POSITIVE (*) NEGATIVE   CBC     Status: Normal   Collection Time   01/05/12  4:22 PM      Component Value Range   WBC 9.4  4.0 - 10.5 (K/uL)   RBC 4.26  3.87 - 5.11 (MIL/uL)   Hemoglobin 12.2  12.0 - 15.0 (g/dL)   HCT 08.6  57.8 - 46.9 (%)   MCV 87.8  78.0 - 100.0 (fL)   MCH 28.6  26.0 - 34.0 (pg)   MCHC 32.6  30.0 - 36.0 (g/dL)   RDW 62.9  52.8 - 41.3 (%)   Platelets 199  150 - 400 (K/uL)  DIFFERENTIAL     Status: Normal   Collection Time   01/05/12  4:22 PM      Component Value Range   Neutrophils Relative 59  43 - 77 (%)   Neutro Abs 5.6  1.7 - 7.7 (K/uL)   Lymphocytes Relative 32  12 - 46 (%)   Lymphs Abs 3.0  0.7 - 4.0 (K/uL)   Monocytes Relative 8  3 - 12 (%)   Monocytes Absolute 0.8  0.1 - 1.0 (K/uL)   Eosinophils Relative 0  0 - 5 (%)   Eosinophils Absolute 0.0  0.0 - 0.7 (K/uL)   Basophils Relative 0  0 - 1 (%)   Basophils Absolute 0.0  0.0 - 0.1 (K/uL)  HCG, QUANTITATIVE, PREGNANCY     Status: Abnormal   Collection Time   01/05/12  4:22 PM      Component Value Range   hCG, Beta Chain, Quant, S 24401 (*) <5 (mIU/mL)  ABO/RH     Status: Normal   Collection Time   01/05/12  4:22 PM      Component Value Range   ABO/RH(D) O POS    PROGESTERONE     Status: Normal   Collection Time   01/05/12  4:22 PM      Component Value Range   Progesterone 14.8     Ultrasound shows a 7 week 1 day IUP with heart rate of 144 and EDD of 08/27/12  Assessment:  Viable IUP @  7.[redacted] weeks gestation   Vaginal  bleeding in early pregnancy  Plan:  Follow up with Dr. Henderson Cloud as planned   Return here as needed   GC, Chlamydia cultures pending.    I have reviewed this patient's vital signs, nurses notes, appropriate labs and imaging. I have discussed this patient with Dr.   Henderson Cloud.  ED Course  Procedures    MDM

## 2012-01-05 NOTE — MAU Note (Signed)
Pt states is high risk for hx epilepsy, took +upt at home Monday night. Noted brown spotting last few days, had intermittent cramping, then went away. Denies pain at present.

## 2012-01-05 NOTE — MAU Note (Signed)
Pt states having brown vaginal discharge and cramping yesterday.

## 2012-01-05 NOTE — MAU Note (Signed)
Pt states had to take progesterone suppositories with first pregnancy and is concerned about this pregnancy

## 2012-01-05 NOTE — Discharge Instructions (Signed)
Your ultrasound tonight shows that you are 7 weeks and one day pregnant with a due date of 08/27/12. Follow up with Dr. Henderson Cloud as planned. Return here as needed.  Threatened Miscarriage  A threatened miscarriage is a pregnancy that may end. It may be marked by bleeding during the first 20 weeks of pregnancy. Often, the pregnancy can continue without any more problems. You may be asked to stop:  Having sex (intercourse).   Having orgasms.   Using tampons.   Exercising.   Doing heavy physical activity and work.  HOME CARE   Your doctor may tell you to take bed rest and to stop activities and work.   Write down the number of pads you use each day. Write down how often you change pads. Write down how soaked they are.   Follow your doctor's advice for follow-up visits and tests.   If your blood type is Rh-negative and the father's blood is Rh-positive (or is not known), you may get a shot to protect the baby.   If you have a miscarriage, save all the tissue you pass in a container. Take the container to your doctor.  GET HELP RIGHT AWAY IF:   You have bad cramps or pain in your belly (abdomen), lower belly, or back.   You have a fever or chills.   Your bleeding gets worse or you pass large clots of blood or tissue. Save this tissue to show your doctor.   You feel lightheaded, weak, dizzy, or pass out (faint).   You have a gush of fluid from your vagina.  MAKE SURE YOU:   Understand these instructions.   Will watch your condition.   Will get help right away if you are not doing well or get worse.  Document Released: 08/11/2008 Document Revised: 08/18/2011 Document Reviewed: 09/14/2009 [facility] Patient Information 2012 [place], [place].Pelvic Rest Pelvic rest is sometimes recommended for women when:   The placenta is partially or completely covering the opening of the cervix (placenta previa).   There is bleeding between the uterine wall and the amniotic sac in the first  trimester (subchorionic hemorrhage).   The cervix begins to open without labor starting (incompetent cervix, cervical insufficiency).   The labor is too early (preterm labor).  HOME CARE INSTRUCTIONS  Do not have sexual intercourse, stimulation, or an orgasm.   Do not use tampons, douche, or put anything in the vagina.   Do not lift anything over 10 pounds (4.5 kg).   Avoid strenuous activity or straining your pelvic muscles.  SEEK MEDICAL CARE IF:  You have any vaginal bleeding during pregnancy. Treat this as a potential emergency.   You have cramping pain felt low in the stomach (stronger than menstrual cramps).   You notice vaginal discharge (watery, mucus, or bloody).   You have a low, dull backache.   There are regular contractions or uterine tightening.  SEEK IMMEDIATE MEDICAL CARE IF: You have vaginal bleeding and have placenta previa.  Document Released: 12/24/2010 Document Revised: 08/18/2011 Document Reviewed: 12/24/2010 [facility] Patient Information 2012 [place], [place].Marland Kitchen

## 2012-01-20 LAB — OB RESULTS CONSOLE ABO/RH: RH Type: POSITIVE

## 2012-01-20 LAB — OB RESULTS CONSOLE HEPATITIS B SURFACE ANTIGEN
Hepatitis B Surface Ag: NEGATIVE
Hepatitis B Surface Ag: NEGATIVE

## 2012-01-20 LAB — OB RESULTS CONSOLE ANTIBODY SCREEN: Antibody Screen: NEGATIVE

## 2012-01-20 LAB — OB RESULTS CONSOLE HIV ANTIBODY (ROUTINE TESTING)
HIV: NONREACTIVE
HIV: NONREACTIVE

## 2012-01-20 LAB — OB RESULTS CONSOLE RPR: RPR: NONREACTIVE

## 2012-01-20 LAB — OB RESULTS CONSOLE RUBELLA ANTIBODY, IGM: Rubella: IMMUNE

## 2012-01-23 ENCOUNTER — Other Ambulatory Visit: Payer: Self-pay

## 2012-02-01 ENCOUNTER — Other Ambulatory Visit (HOSPITAL_COMMUNITY): Payer: Self-pay | Admitting: Obstetrics and Gynecology

## 2012-02-01 DIAGNOSIS — O09529 Supervision of elderly multigravida, unspecified trimester: Secondary | ICD-10-CM

## 2012-02-28 ENCOUNTER — Ambulatory Visit (HOSPITAL_COMMUNITY)
Admission: RE | Admit: 2012-02-28 | Discharge: 2012-02-28 | Disposition: A | Discharge status: Home / Self Care | Payer: Medicaid Other | Source: Ambulatory Visit | Attending: Obstetrics and Gynecology | Admitting: Obstetrics and Gynecology

## 2012-02-28 DIAGNOSIS — G40909 Epilepsy, unspecified, not intractable, without status epilepticus: Secondary | ICD-10-CM

## 2012-02-28 NOTE — Progress Notes (Signed)
MFM note  Kathleen Blake is a 41 yo G4P2012 currently at 47 1/7 weeks who is seen today due to history of seizure disorder.  She reports a history of seizures since age 44.  Her last documented seizure was in February 2013.  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  In her previous pregnancies, she was treated with Tegretol.  In February, she was started on Lamictal, but stopped as soon as she found out that she was pregnant.  She is not currently followed by a neurologist due to some lapses in insurance.  Her prenatal course has also been complicated by chronic hypertension.  She is currently on Labetolol 100 mg BID.  She is without complaints today.  POB hx: 1) Term C-section for "failed induction"                2) Elective repeat C-section at term  PMH - Seizure disorder, Chronic hypertension  PSH - C-section  Meds: Labetolol 100 mg BID, Folic acid 4 mg daily  Social: denies ETOH, tobacco or illicit drug use  Family hx: paternal uncle with "hole in the heart"  Impression/ Plan: 1) Seizure disorder - Reviewed medication profile for both Tegretol and Lamictal in pregnancy.  Tegretol has been associated with Open neural tube defects and craniofacial abnormalities.  Lamictal may be associated with oral clefts.  At this point in gestation (beyond the first trimester) risks for fetal anomalies with either medication is essentially negligible and the benefits of resuming anti-epileptic drugs to prevent seizures far outweighs their risks to the fetus.  After our discussion, the patient strongly desired to resume Tegretol which is the medication that she used in prior pregnancies.  At her request, I wrote a prescribed Tegretol 200 mg qHS which is the dose that she used during her previous pregnancy, but would strongly recommend that she be evaluated by a Neurologist for follow up and further recommendations.    The patient is currently scheduled for a comprehensive ultrasound on  10 July at this office for further evaluation.  Thank you for this referral.  Please contact our office if we can be of further assistance.   Alpha Gula, MD  I spent approximately 30 minutes with this patient with over 50% of time spent in face-to-face counseling.

## 2012-03-07 ENCOUNTER — Other Ambulatory Visit: Payer: Self-pay | Admitting: Obstetrics and Gynecology

## 2012-03-07 DIAGNOSIS — R590 Localized enlarged lymph nodes: Secondary | ICD-10-CM

## 2012-03-12 ENCOUNTER — Ambulatory Visit
Admission: RE | Admit: 2012-03-12 | Discharge: 2012-03-12 | Disposition: A | Discharge status: Home / Self Care | Payer: Medicaid Other | Source: Ambulatory Visit | Attending: Obstetrics and Gynecology | Admitting: Obstetrics and Gynecology

## 2012-03-12 DIAGNOSIS — R590 Localized enlarged lymph nodes: Secondary | ICD-10-CM

## 2012-03-21 ENCOUNTER — Encounter (HOSPITAL_COMMUNITY): Payer: Self-pay

## 2012-03-21 ENCOUNTER — Ambulatory Visit (HOSPITAL_COMMUNITY): Payer: Self-pay

## 2012-03-21 ENCOUNTER — Ambulatory Visit (HOSPITAL_COMMUNITY)
Admission: RE | Admit: 2012-03-21 | Discharge: 2012-03-21 | Disposition: A | Discharge status: Home / Self Care | Payer: Medicaid Other | Source: Ambulatory Visit | Attending: Obstetrics and Gynecology | Admitting: Obstetrics and Gynecology

## 2012-03-21 DIAGNOSIS — O09299 Supervision of pregnancy with other poor reproductive or obstetric history, unspecified trimester: Secondary | ICD-10-CM | POA: Insufficient documentation

## 2012-03-21 DIAGNOSIS — O34219 Maternal care for unspecified type scar from previous cesarean delivery: Secondary | ICD-10-CM | POA: Insufficient documentation

## 2012-03-21 DIAGNOSIS — O10019 Pre-existing essential hypertension complicating pregnancy, unspecified trimester: Secondary | ICD-10-CM | POA: Insufficient documentation

## 2012-03-21 DIAGNOSIS — O09529 Supervision of elderly multigravida, unspecified trimester: Secondary | ICD-10-CM | POA: Insufficient documentation

## 2012-03-21 NOTE — ED Notes (Signed)
Pt states she has not taken her BP meds in a "couple of days".

## 2012-04-26 ENCOUNTER — Encounter: Payer: Self-pay | Admitting: Maternal and Fetal Medicine

## 2012-04-26 LAB — US OB FOLLOW UP

## 2012-07-26 ENCOUNTER — Ambulatory Visit (HOSPITAL_COMMUNITY): Payer: Medicaid Other

## 2012-08-06 ENCOUNTER — Encounter (HOSPITAL_COMMUNITY): Payer: Self-pay | Admitting: Pharmacist

## 2012-08-15 ENCOUNTER — Encounter (HOSPITAL_COMMUNITY): Payer: Self-pay | Admitting: Anesthesiology

## 2012-08-15 ENCOUNTER — Inpatient Hospital Stay (HOSPITAL_COMMUNITY)
Admission: AD | Admit: 2012-08-15 | Discharge: 2012-08-18 | DRG: 765 | Disposition: A | Discharge status: Home / Self Care | Payer: Medicaid Other | Source: Ambulatory Visit | Attending: Obstetrics and Gynecology | Admitting: Obstetrics and Gynecology

## 2012-08-15 ENCOUNTER — Encounter (HOSPITAL_COMMUNITY)
Admission: AD | Disposition: A | Discharge status: Home / Self Care | Payer: Self-pay | Source: Ambulatory Visit | Attending: Obstetrics and Gynecology

## 2012-08-15 ENCOUNTER — Inpatient Hospital Stay (HOSPITAL_COMMUNITY): Payer: Medicaid Other | Admitting: Anesthesiology

## 2012-08-15 ENCOUNTER — Encounter (HOSPITAL_COMMUNITY): Payer: Self-pay

## 2012-08-15 ENCOUNTER — Encounter (HOSPITAL_COMMUNITY): Payer: Self-pay | Admitting: *Deleted

## 2012-08-15 ENCOUNTER — Encounter (HOSPITAL_COMMUNITY)
Admission: RE | Admit: 2012-08-15 | Discharge: 2012-08-15 | Disposition: A | Discharge status: Home / Self Care | Payer: Medicaid Other | Source: Ambulatory Visit | Attending: Obstetrics and Gynecology | Admitting: Obstetrics and Gynecology

## 2012-08-15 DIAGNOSIS — Z01812 Encounter for preprocedural laboratory examination: Secondary | ICD-10-CM | POA: Insufficient documentation

## 2012-08-15 DIAGNOSIS — O34219 Maternal care for unspecified type scar from previous cesarean delivery: Principal | ICD-10-CM | POA: Diagnosis present

## 2012-08-15 DIAGNOSIS — G40909 Epilepsy, unspecified, not intractable, without status epilepticus: Secondary | ICD-10-CM | POA: Diagnosis present

## 2012-08-15 DIAGNOSIS — O09529 Supervision of elderly multigravida, unspecified trimester: Secondary | ICD-10-CM | POA: Diagnosis present

## 2012-08-15 DIAGNOSIS — O1002 Pre-existing essential hypertension complicating childbirth: Secondary | ICD-10-CM | POA: Diagnosis present

## 2012-08-15 DIAGNOSIS — Z302 Encounter for sterilization: Secondary | ICD-10-CM

## 2012-08-15 DIAGNOSIS — Z01818 Encounter for other preprocedural examination: Secondary | ICD-10-CM | POA: Insufficient documentation

## 2012-08-15 DIAGNOSIS — O99354 Diseases of the nervous system complicating childbirth: Secondary | ICD-10-CM | POA: Diagnosis present

## 2012-08-15 HISTORY — PX: TUBAL LIGATION: SHX77

## 2012-08-15 HISTORY — DX: Essential (primary) hypertension: I10

## 2012-08-15 HISTORY — DX: Epilepsy, unspecified, not intractable, without status epilepticus: G40.909

## 2012-08-15 HISTORY — DX: Polycystic ovarian syndrome: E28.2

## 2012-08-15 HISTORY — DX: Anxiety disorder, unspecified: F41.9

## 2012-08-15 LAB — SURGICAL PCR SCREEN: Staphylococcus aureus: NEGATIVE

## 2012-08-15 LAB — CBC
MCH: 27.3 pg (ref 26.0–34.0)
MCHC: 32 g/dL (ref 30.0–36.0)
Platelets: 199 10*3/uL (ref 150–400)

## 2012-08-15 LAB — COMPREHENSIVE METABOLIC PANEL
ALT: 7 U/L (ref 0–35)
AST: 15 U/L (ref 0–37)
Albumin: 2.9 g/dL — ABNORMAL LOW (ref 3.5–5.2)
Calcium: 9.1 mg/dL (ref 8.4–10.5)
Sodium: 133 mEq/L — ABNORMAL LOW (ref 135–145)
Total Protein: 6.9 g/dL (ref 6.0–8.3)

## 2012-08-15 LAB — TYPE AND SCREEN
ABO/RH(D): O POS
Antibody Screen: NEGATIVE

## 2012-08-15 LAB — URIC ACID: Uric Acid, Serum: 5.6 mg/dL (ref 2.4–7.0)

## 2012-08-15 SURGERY — Surgical Case
Anesthesia: Spinal | Wound class: Clean Contaminated

## 2012-08-15 MED ORDER — DIPHENHYDRAMINE HCL 50 MG/ML IJ SOLN: 25.0000 mg | INTRAMUSCULAR | Status: DC | PRN

## 2012-08-15 MED ORDER — OXYTOCIN 40 UNITS IN LACTATED RINGERS INFUSION - SIMPLE MED: 62.5000 mL/h | INTRAVENOUS | Status: AC

## 2012-08-15 MED ORDER — CEFAZOLIN SODIUM-DEXTROSE 2-3 GM-% IV SOLR
2.0000 g | INTRAVENOUS | Status: DC
  Filled 2012-08-15: qty 50

## 2012-08-15 MED ORDER — OXYCODONE-ACETAMINOPHEN 5-325 MG PO TABS
1.0000 | ORAL_TABLET | ORAL | Status: DC | PRN
  Administered 2012-08-15 – 2012-08-17 (×2): 2 via ORAL
  Administered 2012-08-17 (×2): 1 via ORAL
  Filled 2012-08-15 (×2): qty 2
  Filled 2012-08-15 (×2): qty 1

## 2012-08-15 MED ORDER — SCOPOLAMINE 1 MG/3DAYS TD PT72
1.0000 | MEDICATED_PATCH | Freq: Once | TRANSDERMAL | Status: DC
  Administered 2012-08-15: 1.5 mg via TRANSDERMAL

## 2012-08-15 MED ORDER — GLYCOPYRROLATE 0.2 MG/ML IJ SOLN
INTRAMUSCULAR | Status: DC | PRN
  Administered 2012-08-15: 0.2 mg via INTRAVENOUS

## 2012-08-15 MED ORDER — SIMETHICONE 80 MG PO CHEW
80.0000 mg | CHEWABLE_TABLET | Freq: Three times a day (TID) | ORAL | Status: DC
  Administered 2012-08-15 – 2012-08-18 (×8): 80 mg via ORAL

## 2012-08-15 MED ORDER — MEPERIDINE HCL 25 MG/ML IJ SOLN: 6.2500 mg | INTRAMUSCULAR | Status: DC | PRN

## 2012-08-15 MED ORDER — CEFAZOLIN SODIUM-DEXTROSE 2-3 GM-% IV SOLR
INTRAVENOUS | Status: DC | PRN
  Administered 2012-08-15: 2 g via INTRAVENOUS

## 2012-08-15 MED ORDER — SODIUM CHLORIDE 0.9 % IJ SOLN: 3.0000 mL | INTRAMUSCULAR | Status: DC | PRN

## 2012-08-15 MED ORDER — HYDROMORPHONE HCL PF 1 MG/ML IJ SOLN: 0.2500 mg | INTRAMUSCULAR | Status: DC | PRN

## 2012-08-15 MED ORDER — TETANUS-DIPHTH-ACELL PERTUSSIS 5-2.5-18.5 LF-MCG/0.5 IM SUSP
0.5000 mL | Freq: Once | INTRAMUSCULAR | Status: AC
  Administered 2012-08-16: 0.5 mL via INTRAMUSCULAR
  Filled 2012-08-15: qty 0.5

## 2012-08-15 MED ORDER — KETOROLAC TROMETHAMINE 60 MG/2ML IM SOLN
60.0000 mg | Freq: Once | INTRAMUSCULAR | Status: AC | PRN
  Administered 2012-08-15: 60 mg via INTRAMUSCULAR

## 2012-08-15 MED ORDER — FAMOTIDINE IN NACL 20-0.9 MG/50ML-% IV SOLN
20.0000 mg | Freq: Once | INTRAVENOUS | Status: DC
  Filled 2012-08-15: qty 50

## 2012-08-15 MED ORDER — EPHEDRINE SULFATE 50 MG/ML IJ SOLN
INTRAMUSCULAR | Status: DC | PRN
  Administered 2012-08-15 (×5): 10 mg via INTRAVENOUS

## 2012-08-15 MED ORDER — WITCH HAZEL-GLYCERIN EX PADS: 1.0000 "application " | MEDICATED_PAD | CUTANEOUS | Status: DC | PRN

## 2012-08-15 MED ORDER — MORPHINE SULFATE (PF) 0.5 MG/ML IJ SOLN
INTRAMUSCULAR | Status: DC | PRN
  Administered 2012-08-15: .15 mg via INTRATHECAL

## 2012-08-15 MED ORDER — SENNOSIDES-DOCUSATE SODIUM 8.6-50 MG PO TABS
2.0000 | ORAL_TABLET | Freq: Every day | ORAL | Status: DC
  Administered 2012-08-15 – 2012-08-17 (×3): 2 via ORAL

## 2012-08-15 MED ORDER — ONDANSETRON HCL 4 MG/2ML IJ SOLN: 4.0000 mg | INTRAMUSCULAR | Status: DC | PRN

## 2012-08-15 MED ORDER — LABETALOL HCL 200 MG PO TABS
200.0000 mg | ORAL_TABLET | Freq: Two times a day (BID) | ORAL | Status: DC
  Administered 2012-08-15 – 2012-08-16 (×3): 200 mg via ORAL
  Filled 2012-08-15 (×4): qty 1

## 2012-08-15 MED ORDER — PRENATAL MULTIVITAMIN CH
1.0000 | ORAL_TABLET | Freq: Every day | ORAL | Status: DC
  Administered 2012-08-16 – 2012-08-18 (×2): 1 via ORAL
  Filled 2012-08-15 (×2): qty 1

## 2012-08-15 MED ORDER — NALBUPHINE HCL 10 MG/ML IJ SOLN: 5.0000 mg | INTRAMUSCULAR | Status: DC | PRN

## 2012-08-15 MED ORDER — CARBAMAZEPINE 200 MG PO TABS
200.0000 mg | ORAL_TABLET | Freq: Every day | ORAL | Status: DC
  Administered 2012-08-15 – 2012-08-17 (×3): 200 mg via ORAL
  Filled 2012-08-15 (×4): qty 1

## 2012-08-15 MED ORDER — MENTHOL 3 MG MT LOZG: 1.0000 | LOZENGE | OROMUCOSAL | Status: DC | PRN

## 2012-08-15 MED ORDER — NALOXONE HCL 0.4 MG/ML IJ SOLN: 0.4000 mg | INTRAMUSCULAR | Status: DC | PRN

## 2012-08-15 MED ORDER — DIPHENHYDRAMINE HCL 25 MG PO CAPS
25.0000 mg | ORAL_CAPSULE | ORAL | Status: DC | PRN
  Filled 2012-08-15: qty 1

## 2012-08-15 MED ORDER — LACTATED RINGERS IV SOLN
INTRAVENOUS | Status: DC
  Administered 2012-08-15: 21:00:00 via INTRAVENOUS

## 2012-08-15 MED ORDER — KETOROLAC TROMETHAMINE 30 MG/ML IJ SOLN: 30.0000 mg | Freq: Four times a day (QID) | INTRAMUSCULAR | Status: AC | PRN

## 2012-08-15 MED ORDER — IBUPROFEN 600 MG PO TABS
600.0000 mg | ORAL_TABLET | Freq: Four times a day (QID) | ORAL | Status: DC
  Administered 2012-08-16 – 2012-08-18 (×10): 600 mg via ORAL
  Filled 2012-08-15 (×11): qty 1

## 2012-08-15 MED ORDER — ONDANSETRON HCL 4 MG/2ML IJ SOLN: 4.0000 mg | Freq: Three times a day (TID) | INTRAMUSCULAR | Status: DC | PRN

## 2012-08-15 MED ORDER — FENTANYL CITRATE 0.05 MG/ML IJ SOLN
INTRAMUSCULAR | Status: DC | PRN
  Administered 2012-08-15: 25 ug via INTRATHECAL

## 2012-08-15 MED ORDER — MAGNESIUM SULFATE 40 G IN LACTATED RINGERS - SIMPLE: 2.0000 g/h | Freq: Once | INTRAVENOUS | Status: DC

## 2012-08-15 MED ORDER — DIBUCAINE 1 % RE OINT: 1.0000 "application " | TOPICAL_OINTMENT | RECTAL | Status: DC | PRN

## 2012-08-15 MED ORDER — ONDANSETRON HCL 4 MG PO TABS: 4.0000 mg | ORAL_TABLET | ORAL | Status: DC | PRN

## 2012-08-15 MED ORDER — ZOLPIDEM TARTRATE 5 MG PO TABS: 5.0000 mg | ORAL_TABLET | Freq: Every evening | ORAL | Status: DC | PRN

## 2012-08-15 MED ORDER — LACTATED RINGERS IV SOLN
INTRAVENOUS | Status: DC
  Administered 2012-08-15 (×3): via INTRAVENOUS

## 2012-08-15 MED ORDER — MAGNESIUM SULFATE BOLUS VIA INFUSION
4.0000 g | Freq: Once | INTRAVENOUS | Status: AC
  Administered 2012-08-15: 4 g via INTRAVENOUS
  Filled 2012-08-15: qty 500

## 2012-08-15 MED ORDER — NALOXONE HCL 1 MG/ML IJ SOLN: 1.0000 ug/kg/h | INTRAVENOUS | Status: DC | PRN

## 2012-08-15 MED ORDER — MAGNESIUM SULFATE 40 G IN LACTATED RINGERS - SIMPLE
2.0000 g/h | INTRAVENOUS | Status: AC
  Filled 2012-08-15 (×2): qty 500

## 2012-08-15 MED ORDER — KETOROLAC TROMETHAMINE 60 MG/2ML IM SOLN
INTRAMUSCULAR | Status: AC
  Administered 2012-08-15: 60 mg via INTRAMUSCULAR
  Filled 2012-08-15: qty 2

## 2012-08-15 MED ORDER — DIPHENHYDRAMINE HCL 25 MG PO CAPS: 25.0000 mg | ORAL_CAPSULE | Freq: Four times a day (QID) | ORAL | Status: DC | PRN

## 2012-08-15 MED ORDER — DIPHENHYDRAMINE HCL 50 MG/ML IJ SOLN
12.5000 mg | INTRAMUSCULAR | Status: DC | PRN
  Administered 2012-08-16: 12.5 mg via INTRAVENOUS
  Filled 2012-08-15 (×2): qty 1

## 2012-08-15 MED ORDER — CITRIC ACID-SODIUM CITRATE 334-500 MG/5ML PO SOLN
30.0000 mL | Freq: Once | ORAL | Status: AC
  Administered 2012-08-15: 30 mL via ORAL
  Filled 2012-08-15: qty 15

## 2012-08-15 MED ORDER — MAGNESIUM SULFATE 40 MG/ML IJ SOLN: 4.0000 g | Freq: Once | INTRAMUSCULAR | Status: DC

## 2012-08-15 MED ORDER — SIMETHICONE 80 MG PO CHEW
80.0000 mg | CHEWABLE_TABLET | ORAL | Status: DC | PRN
  Administered 2012-08-17: 80 mg via ORAL

## 2012-08-15 MED ORDER — METOCLOPRAMIDE HCL 5 MG/ML IJ SOLN: 10.0000 mg | Freq: Three times a day (TID) | INTRAMUSCULAR | Status: DC | PRN

## 2012-08-15 MED ORDER — OXYTOCIN 10 UNIT/ML IJ SOLN
40.0000 [IU] | INTRAVENOUS | Status: DC | PRN
  Administered 2012-08-15: 40 [IU] via INTRAVENOUS

## 2012-08-15 MED ORDER — LANOLIN HYDROUS EX OINT: 1.0000 "application " | TOPICAL_OINTMENT | CUTANEOUS | Status: DC | PRN

## 2012-08-15 MED ORDER — NALBUPHINE HCL 10 MG/ML IJ SOLN
10.0000 mg | INTRAMUSCULAR | Status: DC | PRN
  Administered 2012-08-15: 10 mg via INTRAVENOUS
  Filled 2012-08-15: qty 1

## 2012-08-15 MED ORDER — LACTATED RINGERS IV SOLN
INTRAVENOUS | Status: DC | PRN
  Administered 2012-08-15: 18:00:00 via INTRAVENOUS

## 2012-08-15 MED ORDER — ONDANSETRON HCL 4 MG/2ML IJ SOLN
INTRAMUSCULAR | Status: DC | PRN
  Administered 2012-08-15: 4 mg via INTRAVENOUS

## 2012-08-15 MED ORDER — IBUPROFEN 600 MG PO TABS: 600.0000 mg | ORAL_TABLET | Freq: Four times a day (QID) | ORAL | Status: DC | PRN

## 2012-08-15 MED ORDER — NALBUPHINE SYRINGE 5 MG/0.5 ML
INJECTION | INTRAMUSCULAR | Status: AC
  Administered 2012-08-15: 10 mg via INTRAVENOUS
  Filled 2012-08-15: qty 1

## 2012-08-15 MED ORDER — SCOPOLAMINE 1 MG/3DAYS TD PT72
MEDICATED_PATCH | TRANSDERMAL | Status: AC
  Administered 2012-08-15: 1.5 mg via TRANSDERMAL
  Filled 2012-08-15: qty 1

## 2012-08-15 SURGICAL SUPPLY — 33 items
BENZOIN TINCTURE PRP APPL 2/3 (GAUZE/BANDAGES/DRESSINGS) IMPLANT
CLOTH BEACON ORANGE TIMEOUT ST (SAFETY) ×3 IMPLANT
CONTAINER PREFILL 10% NBF 15ML (MISCELLANEOUS) IMPLANT
DERMABOND ADVANCED (GAUZE/BANDAGES/DRESSINGS)
DERMABOND ADVANCED .7 DNX12 (GAUZE/BANDAGES/DRESSINGS) IMPLANT
DRESSING TELFA 8X3 (GAUZE/BANDAGES/DRESSINGS) IMPLANT
DRSG COVADERM 4X10 (GAUZE/BANDAGES/DRESSINGS) IMPLANT
DURAPREP 26ML APPLICATOR (WOUND CARE) ×3 IMPLANT
ELECT REM PT RETURN 9FT ADLT (ELECTROSURGICAL) ×3
ELECTRODE REM PT RTRN 9FT ADLT (ELECTROSURGICAL) ×2 IMPLANT
EXTRACTOR VACUUM M CUP 4 TUBE (SUCTIONS) IMPLANT
GAUZE SPONGE 4X4 12PLY STRL LF (GAUZE/BANDAGES/DRESSINGS) ×6 IMPLANT
GLOVE BIO SURGEON STRL SZ8 (GLOVE) ×6 IMPLANT
GOWN PREVENTION PLUS LG XLONG (DISPOSABLE) ×3 IMPLANT
KIT ABG SYR 3ML LUER SLIP (SYRINGE) ×3 IMPLANT
NEEDLE HYPO 25X5/8 SAFETYGLIDE (NEEDLE) ×3 IMPLANT
NS IRRIG 1000ML POUR BTL (IV SOLUTION) ×3 IMPLANT
PACK C SECTION WH (CUSTOM PROCEDURE TRAY) ×3 IMPLANT
PAD ABD 7.5X8 STRL (GAUZE/BANDAGES/DRESSINGS) IMPLANT
PAD OB MATERNITY 4.3X12.25 (PERSONAL CARE ITEMS) IMPLANT
SLEEVE SCD COMPRESS KNEE MED (MISCELLANEOUS) IMPLANT
STAPLER VISISTAT 35W (STAPLE) ×3 IMPLANT
STRIP CLOSURE SKIN 1/2X4 (GAUZE/BANDAGES/DRESSINGS) IMPLANT
SUT CHROMIC 2 0 CT 1 (SUTURE) ×3 IMPLANT
SUT MNCRL 0 VIOLET CTX 36 (SUTURE) ×8 IMPLANT
SUT MONOCRYL 0 CTX 36 (SUTURE) ×4
SUT PDS AB 0 CTX 60 (SUTURE) ×3 IMPLANT
SUT PLAIN 0 NONE (SUTURE) IMPLANT
SUT PLAIN 2 0 XLH (SUTURE) ×3 IMPLANT
SUT VIC AB 4-0 KS 27 (SUTURE) ×3 IMPLANT
TOWEL OR 17X24 6PK STRL BLUE (TOWEL DISPOSABLE) ×6 IMPLANT
TRAY FOLEY CATH 14FR (SET/KITS/TRAYS/PACK) ×3 IMPLANT
WATER STERILE IRR 1000ML POUR (IV SOLUTION) ×3 IMPLANT

## 2012-08-15 NOTE — Brief Op Note (Signed)
08/15/2012  6:18 PM  PATIENT:  [name]  41 y.o. female  PRE-OPERATIVE DIAGNOSIS:  Previous Cesarean Section;Pregnancy-Induced Hypertension  POST-OPERATIVE DIAGNOSIS:  Previous Cesarean Section;Pregnancy-Induced Hypertension  PROCEDURE:  Procedure(s) (LRB) with comments: CESAREAN SECTION (N/A) BILATERAL TUBAL LIGATION (Bilateral)  SURGEON:  Surgeon(s) and Role:    * Leslie Andrea, MD - Primary  PHYSICIAN ASSISTANT:   ASSISTANTS: none   ANESTHESIA:   spinal  EBL:  Total I/O In: 800 [I.V.:800] Out: 700 [Urine:100; Blood:600]  BLOOD ADMINISTERED:none  DRAINS: Urinary Catheter (Foley)   LOCAL MEDICATIONS USED:  NONE  SPECIMEN:  Source of Specimen:  placenta,bilateral fallopian tube segments  DISPOSITION OF SPECIMEN:  PATHOLOGY  COUNTS:  YES  TOURNIQUET:  * No tourniquets in log *  DICTATION: .Other Dictation: Dictation Number [id number]  PLAN OF CARE: Admit to inpatient   PATIENT DISPOSITION:  PACU - hemodynamically stable.   Delay start of Pharmacological VTE agent (>24hrs) due to surgical blood loss or risk of bleeding: not applicable

## 2012-08-15 NOTE — Transfer of Care (Signed)
Immediate Anesthesia Transfer of Care Note  Patient: Kathleen Blake  Procedure(s) Performed: Procedure(s) (LRB) with comments: CESAREAN SECTION (N/A) BILATERAL TUBAL LIGATION (Bilateral)  Patient Location: PACU  Anesthesia Type:Spinal  Level of Consciousness: awake, alert  and oriented  Airway & Oxygen Therapy: Patient Spontanous Breathing  Post-op Assessment: Report given to PACU RN and Post -op Vital signs reviewed and stable  Post vital signs: Reviewed and stable  Complications: No apparent anesthesia complications

## 2012-08-15 NOTE — Anesthesia Postprocedure Evaluation (Signed)
Anesthesia Post Note  Patient: Kathleen Blake  Procedure(s) Performed: Procedure(s) (LRB): CESAREAN SECTION (N/A) BILATERAL TUBAL LIGATION (Bilateral)  Anesthesia type: Spinal  Patient location: PACU  Post pain: Pain level controlled  Post assessment: Post-op Vital signs reviewed  Last Vitals:  Filed Vitals:   08/15/12 1900  BP: 136/86  Pulse: 66  Temp:   Resp: 17    Post vital signs: Reviewed  Level of consciousness: awake  Complications: No apparent anesthesia complications

## 2012-08-15 NOTE — Anesthesia Preprocedure Evaluation (Addendum)
Anesthesia Evaluation  Patient identified by MRN, date of birth, ID band Patient awake    Reviewed: Allergy & Precautions, H&P , Patient's Chart, lab work & pertinent test results  Airway Mallampati: III TM Distance: >3 FB Neck ROM: full    Dental No notable dental hx. (+) Poor Dentition   Pulmonary  breath sounds clear to auscultation  Pulmonary exam normal       Cardiovascular Exercise Tolerance: Good hypertension, Rhythm:regular Rate:Normal     Neuro/Psych Seizures -, Well Controlled,     GI/Hepatic   Endo/Other    Renal/GU      Musculoskeletal   Abdominal   Peds  Hematology   Anesthesia Other Findings Last seizure in March. Meds increased   Reproductive/Obstetrics                          Anesthesia Physical Anesthesia Plan  ASA: III  Anesthesia Plan: Spinal   Post-op Pain Management:    Induction:   Airway Management Planned:   Additional Equipment:   Intra-op Plan:   Post-operative Plan:   Informed Consent: I have reviewed the patients History and Physical, chart, labs and discussed the procedure including the risks, benefits and alternatives for the proposed anesthesia with the patient or authorized representative who has indicated his/her understanding and acceptance.   Dental Advisory Given  Plan Discussed with: CRNA  Anesthesia Plan Comments: (Lab work confirmed with CRNA in room. Platelets okay. Discussed spinal anesthetic, and patient consents to the procedure:  included risk of possible headache,backache, failed block, allergic reaction, and nerve injury. This patient was asked if she had any questions or concerns before the procedure started. )        Anesthesia Quick Evaluation

## 2012-08-15 NOTE — H&P (Signed)
Kathleen Blake is a 41 y.o. female presenting for repeat cesarean section and BTL. Patient noted to have BP ob approximately 117 diastolic at pre op appt in hospital today. Evaluated in MAU with Creatinine noted to be 1.06. Other labs WNL for pregnancy. No epigastric pain. Pregnancy complicated by [facility], seizure disorder and previous cesarean section.  Maternal Medical History:  Fetal activity: Perceived fetal activity is normal.      OB History    Grav Para Term Preterm Abortions TAB SAB Ect Mult Living   4 2 2  1  1   2      Past Medical History  Diagnosis Date  . Hypertension   . Epilepsy    Past Surgical History  Procedure Date  . Cesarean section    Family History: family history is not on file. Social History:  reports that she has never smoked. She does not have any smokeless tobacco history on file. She reports that she does not drink alcohol or use illicit drugs.   Prenatal Transfer Tool  Maternal Diabetes: No Genetic Screening: Normal Maternal Ultrasounds/Referrals: Normal Fetal Ultrasounds or other Referrals:  None Maternal Substance Abuse:  No Significant Maternal Medications:  Meds include: Other:  Significant Maternal Lab Results:  None Other Comments:  None  Review of Systems  HENT:       Mild HA now with no food today  Eyes: Negative for blurred vision.  Gastrointestinal: Negative for abdominal pain.  Neurological: Positive for headaches.      Blood pressure 173/99, pulse 70, temperature 98 F (36.7 C), temperature source Oral, resp. rate 18, height 5' 8.25" (1.734 m), weight 226 lb 9.6 oz (102.785 kg), SpO2 100.00%.   Fetal Exam Fetal Monitor Review: Pattern: accelerations present.       Physical Exam  Cardiovascular: Normal rate and regular rhythm.   Respiratory: Effort normal and breath sounds normal.  GI: Soft. There is no tenderness.  Neurological: She has normal reflexes.    Prenatal labs: ABO, Rh: --/--/O POS, O POS (12/04  0950) Antibody: NEG (12/04 0950) Rubella: Immune (05/10 0000) RPR: Nonreactive (05/10 0000)  HBsAg: Negative (05/10 0000)  HIV: Non-reactive (05/10 0000)  GBS:     Assessment/Plan: 41 yo G4P2 at 59 0/7 weeks with PIH, CHTN, and seizure disorder for repeat C/S.  Also requests BTL. D/W patient risks-infection, organ damage, DVT/PE  , pneumonia, bleeding/transfusion-HIV/HEP. Permanence of BTL, failure rate, increased ectopic risk, alternative contraception reviewed.   Kathleen Blake,Kathleen Blake 08/15/2012, 5:15 PM

## 2012-08-15 NOTE — Anesthesia Procedure Notes (Signed)

## 2012-08-15 NOTE — OR Nursing (Signed)
Resumed care of patient at 1955-Kathleen Bembenek rn

## 2012-08-15 NOTE — Patient Instructions (Addendum)
20 Kathleen Blake  08/15/2012   Your procedure is scheduled on:  08/17/12  Enter through the Main Entrance of [facility] at 1200 PM  Pick up the phone at the desk and dial [phone].   Call this number if you have problems the morning of surgery: [phone]   Remember:   Do not eat food:After Midnight.  Do not drink clear liquids: 4 Hours before arrival.  Take these medicines the morning of surgery with A SIP OF WATER: Blood Pressure medication   Do not wear jewelry, make-up or nail polish.  Do not wear lotions, powders, or perfumes. You may wear deodorant.  Do not shave 48 hours prior to surgery.  Do not bring valuables to the hospital.  Contacts, dentures or bridgework may not be worn into surgery.  Leave suitcase in the car. After surgery it may be brought to your room.  For patients admitted to the hospital, checkout time is 11:00 AM the day of discharge.   Patients discharged the day of surgery will not be allowed to drive home.  Name and phone number of your driver: NA  Special Instructions: Shower using CHG 2 nights before surgery and the night before surgery.  If you shower the day of surgery use CHG.  Use special wash - you have one bottle of CHG for all showers.  You should use approximately 1/3 of the bottle for each shower.   Please read over the following fact sheets that you were given: MRSA Information

## 2012-08-15 NOTE — Pre-Procedure Instructions (Signed)
Bps of 148/109 and 166/117 called to Dr. Henderson Cloud, order given for patient to go to MAU for evaluation. Pt escorted to MAU.

## 2012-08-15 NOTE — MAU Note (Signed)
Patient sent from preop for NST and PIH labs per Dr. Henderson Cloud

## 2012-08-16 ENCOUNTER — Encounter (HOSPITAL_COMMUNITY): Payer: Self-pay | Admitting: *Deleted

## 2012-08-16 LAB — COMPREHENSIVE METABOLIC PANEL
ALT: 7 U/L (ref 0–35)
AST: 15 U/L (ref 0–37)
Alkaline Phosphatase: 91 U/L (ref 39–117)
CO2: 24 mEq/L (ref 19–32)
Calcium: 8.2 mg/dL — ABNORMAL LOW (ref 8.4–10.5)
Chloride: 102 mEq/L (ref 96–112)
GFR calc Af Amer: 73 mL/min — ABNORMAL LOW (ref >90)
GFR calc non Af Amer: 63 mL/min — ABNORMAL LOW (ref >90)
Glucose, Bld: 101 mg/dL — ABNORMAL HIGH (ref 70–99)
Potassium: 4.1 mEq/L (ref 3.5–5.1)
Sodium: 136 mEq/L (ref 135–145)

## 2012-08-16 LAB — CBC
Hemoglobin: 9.8 g/dL — ABNORMAL LOW (ref 12.0–15.0)
Platelets: 171 10*3/uL (ref 150–400)
RBC: 3.64 MIL/uL — ABNORMAL LOW (ref 3.87–5.11)
WBC: 7.3 10*3/uL (ref 4.0–10.5)

## 2012-08-16 LAB — RPR: RPR Ser Ql: NONREACTIVE

## 2012-08-16 NOTE — Progress Notes (Signed)
Report to Toniann Fail, RN on MBU.  Given transfer report - to room 138 via WC in sts stable condition.

## 2012-08-16 NOTE — Addendum Note (Signed)
Addendum  created 08/16/12 0759 by Renford Dills, CRNA   Modules edited:Notes Section

## 2012-08-16 NOTE — Anesthesia Postprocedure Evaluation (Signed)
  Anesthesia Post-op Note  Patient: Kathleen Blake  Procedure(s) Performed: Procedure(s) (LRB) with comments: CESAREAN SECTION (N/A) BILATERAL TUBAL LIGATION (Bilateral)  Patient Location: A-ICU  Anesthesia Type:Spinal  Level of Consciousness: awake  Airway and Oxygen Therapy: Patient Spontanous Breathing  Post-op Pain: mild  Post-op Assessment: Patient's Cardiovascular Status Stable and Respiratory Function Stable  Post-op Vital Signs: stable  Complications: No apparent anesthesia complications

## 2012-08-16 NOTE — Progress Notes (Signed)
UR chart review completed.  

## 2012-08-16 NOTE — Progress Notes (Signed)
Subjective: Postpartum Day 1: Cesarean Delivery - Pre-eclampsia Patient reports tolerating PO.  No CP/SOB.  No HA, N/V or visual changes  Objective: Vital signs in last 24 hours: Temp:  [97.9 F (36.6 C)-98.2 F (36.8 C)] 98 F (36.7 C) (12/05 0738) Pulse Rate:  [62-85] 72  (12/05 0800) Resp:  [15-26] 18  (12/05 0738) BP: (123-191)/(68-117) 125/81 mmHg (12/05 0800) SpO2:  [91 %-100 %] 96 % (12/05 0800) Weight:  [101.197 kg (223 lb 1.6 oz)-101.606 kg (224 lb)] 101.197 kg (223 lb 1.6 oz) (12/05 0549)  Physical Exam:  General: alert and cooperative Lochia: appropriate Uterine Fundus: firm Incision: no significant drainage DVT Evaluation: No evidence of DVT seen on physical exam.   Basename 08/16/12 0550 08/15/12 0950  HGB 9.8* 10.9*  HCT 31.0* 34.1*    Assessment/Plan: Status post Cesarean section. Doing well postoperatively.  Continue current care.   Continue Mag Sulfate x 24 hrs after delivery  Kathleen Blake 08/16/2012, 9:36 AM

## 2012-08-16 NOTE — Op Note (Signed)
Kathleen Blake, Kathleen Blake             ACCOUNT NO.:  [id number]  MEDICAL RECORD NO.:  [id number]  LOCATION:  9374                          FACILITY:  [facility]  PHYSICIAN:  Guy Sandifer. Henderson Cloud, M.D. DATE OF BIRTH:  18-Dec-1970  DATE OF PROCEDURE:  08/15/2012 DATE OF DISCHARGE:                              OPERATIVE REPORT   PREOPERATIVE DIAGNOSES: 1. Pregnancy-induced hypertension. 2. Previous cesarean section, desires repeat. 3. Desires permanent sterilization.  POSTOPERATIVE DIAGNOSES: 1. Pregnancy-induced hypertension. 2. Previous cesarean section, desires repeat. 3. Desires permanent sterilization.  PROCEDURES: 1. Repeat low-transverse cesarean section. 2. Bilateral tubal ligation.  SURGEON:  Guy Sandifer. Henderson Cloud, M.D.  ANESTHESIA:  Spinal, Jairo Ben, M.D.  ESTIMATED BLOOD LOSS:  750 mL.  SPECIMENS: 1. Placenta. 2. Bilateral fallopian tube segments, both to Pathology.  FINDINGS:  Viable female infant, Apgars of 7 and 8 at 1 and 5 minutes respectively.  Arterial cord pH 7.11.  INDICATIONS AND CONSENT:  This patient is a 41 year old married white female, G4, P2 at 39-0/7th weeks.  Prenatal care was complicated by chronic hypertension and a seizure disorder and previous cesarean section.  The patient was evaluated the day in the hospital for her preoperative visit for her upcoming cesarean section in 2 days.  Blood pressure was noted to have a diastolic of 117.  The patient had forgotten to take her blood pressure medicine that morning.  She has mild headache with no visual changes.  No epigastric pain.  Evaluation in MAU revealed a creatinine that was elevated with otherwise normal labs for pregnancy.  Fetal heart tones were reactive.  Recommendation for cesarean section was made.  Potential risks and complications were reviewed with the patient preoperatively including, but not limited to, infection, organ damage, bleeding requiring transfusion of blood products with HIV  and hepatitis acquisition, DVT, PE, pneumonia.  Tubal ligation was reviewed.  Permanence of the procedure, failure rate, and increased ectopic risk was also reviewed.  All questions were answered and consent was signed on the chart.  PROCEDURE:  The patient was taken to the operating room where she undergo spinal anesthesia With Dr. Jean Rosenthal.  She was placed in dorsal supine position with a 15-degree left lateral wedge.  Foley catheter was placed in the bladder.  She has prepped and draped per hot creams per [facility] protocol.  Time-out was undertaken.  After testing for adequate spinal anesthesia, skin was entered through the previous Pfannenstiel scar and dissection was carried out in layers to the peritoneum.  Peritoneum was incised, extended superiorly and inferiorly. Vesicouterine peritoneum was taken down cephalad laterally.  Bladder flap was developed and the bladder blade was placed.  Uterus was incised in a low-transverse manner and the uterine cavity was entered bluntly with a hemostat.  The uterine incision was extended cephalad laterally with fingers.  Artificial rupture of membranes for clear fluid was carried out.  Vertex was delivered.  Loose nuchal cord was reduced. Baby was then delivered, and cry and tone were noted.  Cord was clamped and cut, and the baby was handed to the awaiting Pediatrics team. Placenta was manually delivered and sent to Pathology.  Uterine cavity was clean.  Uterus was closed in two running  locking imbricating layers of 0 Monocryl suture, which achieved good hemostasis.  There were some adhesions noted around the left adnexa.  These were partially taken down to expose the left fallopian tube, which was identified from cornu to fimbria.  It was grasped in its mid-ampullary portion with Babcock clamp.  The knuckle of tube was low doubly ligated with two free ties of plain suture.  The intervening knuckle was then sharply resected. Monopolar  cautery was used to assure complete hemostasis.  The ovary was normal.  Right tube and ovary were normal.  The right tube was ligated in a similar fashion.  After assuring good hemostasis, the anterior peritoneum was closed in a running fashion with 0 Monocryl suture, which was also used to reapproximate the pyramidalis muscle in the midline.  A 2-0 chromic suture was used to obtain complete hemostasis on the rectus muscle.  The anterior rectus fascia was then closed in running fashion with a 0 looped PDS suture.  Subcutaneous tissues were reapproximated with plain sutures and the skin was closed with clips.  All sponge, instrument, and needle counts were correct, and the patient was transferred to the recovery room in stable condition.     Guy Sandifer Henderson Cloud, M.D.     JET/MEDQ  D:  08/15/2012  T:  08/16/2012  Job:  [id number]

## 2012-08-17 ENCOUNTER — Inpatient Hospital Stay (HOSPITAL_COMMUNITY)
Admission: RE | Admit: 2012-08-17 | Payer: Medicaid Other | Source: Ambulatory Visit | Admitting: Obstetrics and Gynecology

## 2012-08-17 ENCOUNTER — Encounter (HOSPITAL_COMMUNITY): Admission: RE | Payer: Self-pay | Source: Ambulatory Visit

## 2012-08-17 SURGERY — Surgical Case
Anesthesia: Regional | Laterality: Bilateral

## 2012-08-17 MED ORDER — LABETALOL HCL 200 MG PO TABS
200.0000 mg | ORAL_TABLET | Freq: Three times a day (TID) | ORAL | Status: DC
Start: 2012-08-17 — End: 2012-08-18
  Administered 2012-08-17 – 2012-08-18 (×2): 200 mg via ORAL
  Filled 2012-08-17 (×5): qty 1

## 2012-08-17 NOTE — Progress Notes (Signed)
Subjective: Postpartum Day 2: Cesarean Delivery Patient reports tolerating PO, + flatus and no problems voiding.  Denies HA, blurred vision or RUQ pain  Objective: Vital signs in last 24 hours: Temp:  [97.7 F (36.5 C)-98.5 F (36.9 C)] 98.5 F (36.9 C) (12/06 0357) Pulse Rate:  [62-71] 67  (12/06 0357) Resp:  [16-20] 18  (12/06 0357) BP: (107-141)/(69-95) 129/88 mmHg (12/06 0357) SpO2:  [95 %-99 %] 99 % (12/06 0357)  Physical Exam:  General: alert and cooperative Lochia: appropriate Uterine Fundus: firm Incision: abd dressing partially removed. Incision CDI DVT Evaluation: No evidence of DVT seen on physical exam. No significant calf/ankle edema. DTR's 1+   Basename 08/16/12 0550 08/15/12 0950  HGB 9.8* 10.9*  HCT 31.0* 34.1*    Assessment/Plan: Status post Cesarean section. Postoperative course complicated by [facility] and seizure disorder  Increase Labetalol to 200 mg tid.  CURTIS,CAROL G 08/17/2012, 8:17 AM

## 2012-08-18 MED ORDER — IBUPROFEN 600 MG PO TABS: 600.0000 mg | ORAL_TABLET | Freq: Four times a day (QID) | ORAL | Status: DC

## 2012-08-18 MED ORDER — CARBAMAZEPINE 200 MG PO TABS: 200.0000 mg | ORAL_TABLET | Freq: Every day | ORAL | Status: DC

## 2012-08-18 MED ORDER — OXYCODONE-ACETAMINOPHEN 5-325 MG PO TABS: 1.0000 | ORAL_TABLET | ORAL | Status: DC | PRN

## 2012-08-18 MED ORDER — LABETALOL HCL 200 MG PO TABS: 200.0000 mg | ORAL_TABLET | Freq: Three times a day (TID) | ORAL | Status: DC

## 2012-08-18 NOTE — Progress Notes (Signed)
Subjective: Postpartum Day 3: Cesarean Delivery Patient reports tolerating PO, + flatus and no problems voiding.    Objective: Vital signs in last 24 hours: Temp:  [97.9 F (36.6 C)-98.6 F (37 C)] 97.9 F (36.6 C) (12/07 0522) Pulse Rate:  [61-78] 77  (12/07 1057) Resp:  [18-20] 18  (12/07 0522) BP: (113-158)/(67-91) 158/91 mmHg (12/07 1057) SpO2:  [98 %-99 %] 98 % (12/06 2305)  Physical Exam:  General: alert, cooperative, appears stated age and no distress Lochia: appropriate Uterine Fundus: firm Incision: healing well DVT Evaluation: No evidence of DVT seen on physical exam.   Basename 08/16/12 0550  HGB 9.8*  HCT 31.0*    Assessment/Plan: Status post Cesarean section. Doing well postoperatively.  Staples out in 2-3 days Cont labetolol 200 tid with BP check in 2-3 days.  Aima Mcwhirt C 08/18/2012, 11:24 AM

## 2012-08-20 NOTE — Discharge Summary (Signed)
Obstetric Discharge Summary Reason for Admission: cesarean section Prenatal Procedures: ultrasound Intrapartum Procedures: cesarean: low cervical, transverse and tubal ligation Postpartum Procedures: none Complications-Operative and Postpartum: none Hemoglobin  Date Value Range Status  08/16/2012 9.8* 12.0 - 15.0 g/dL Final     HCT  Date Value Range Status  08/16/2012 31.0* 36.0 - 46.0 % Final    Physical Exam:  General: alert and cooperative Lochia: appropriate Uterine Fundus: firm Incision: healing well DVT Evaluation: No evidence of DVT seen on physical exam.  Discharge Diagnoses: Term Pregnancy-delivered  Discharge Information: Date: 08/20/2012 Activity: pelvic rest Diet: routine Medications: PNV, Ibuprofen, Percocet and labetalol Condition: stable Instructions: refer to practice specific booklet Discharge to: home   Newborn Data: Live born female  Birth Weight: 7 lb 14.1 oz (3575 g) APGAR: 7, 8  Home with mother.  Prince Olivier G 08/20/2012, 9:34 AM

## 2013-02-26 ENCOUNTER — Ambulatory Visit (INDEPENDENT_AMBULATORY_CARE_PROVIDER_SITE_OTHER): Payer: Medicaid Other | Admitting: General Practice

## 2013-02-26 ENCOUNTER — Encounter: Payer: Self-pay | Admitting: General Practice

## 2013-02-26 VITALS — BP 107/78 | HR 67 | Temp 97.6°F | Ht 66.0 in | Wt 212.0 lb

## 2013-02-26 DIAGNOSIS — R109 Unspecified abdominal pain: Secondary | ICD-10-CM

## 2013-02-26 DIAGNOSIS — Z8719 Personal history of other diseases of the digestive system: Secondary | ICD-10-CM

## 2013-02-26 LAB — POCT CBC
Granulocyte percent: 44.9 %G (ref 37–80)
HCT, POC: 40.4 % (ref 37.7–47.9)
Lymph, poc: 2.7 (ref 0.6–3.4)
MCH, POC: 29.6 pg (ref 27–31.2)
MCV: 87.5 fL (ref 80–97)
Platelet Count, POC: 188 10*3/uL (ref 142–424)
RBC: 4.6 M/uL (ref 4.04–5.48)
WBC: 5.7 10*3/uL (ref 4.6–10.2)

## 2013-02-26 NOTE — Patient Instructions (Addendum)
Acute Pancreatitis °Acute pancreatitis is a disease in which the pancreas becomes suddenly inflamed. The pancreas is a large gland located behind your stomach. The pancreas produces enzymes that help digest food. The pancreas also releases the hormones glucagon and insulin that help regulate blood sugar. Damage to the pancreas occurs when the digestive enzymes from the pancreas are activated and begin attacking the pancreas before being released into the intestine. Most acute attacks last a couple of days and can cause serious complications. Some people become dehydrated and develop low blood pressure. In severe cases, bleeding into the pancreas can lead to shock and can be life-threatening. The lungs, heart, and kidneys may fail. °CAUSES  °Pancreatitis can happen to anyone. In some cases, the cause is unknown. Most cases are caused by: °· Alcohol abuse. °· Gallstones. °Other less common causes are: °· Certain medicines. °· Exposure to certain chemicals. °· Infection. °· Damage caused by an accident (trauma). °· Abdominal surgery. °SYMPTOMS  °· Pain in the upper abdomen that may radiate to the back. °· Tenderness and swelling of the abdomen. °· Nausea and vomiting. °DIAGNOSIS  °Your caregiver will perform a physical exam. Blood and stool tests may be done to confirm the diagnosis. Imaging tests may also be done, such as X-rays, CT scans, or an ultrasound of the abdomen. °TREATMENT  °Treatment usually requires a stay in the hospital. Treatment may include: °· Pain medicine. °· Fluid replacement through an intravenous line (IV). °· Placing a tube in the stomach to remove stomach contents and control vomiting. °· Not eating for 3 or 4 days. This gives your pancreas a rest, because enzymes are not being produced that can cause further damage. °· Antibiotic medicines if your condition is caused by an infection. °· Surgery of the pancreas or gallbladder. °HOME CARE INSTRUCTIONS  °· Follow the diet advised by your  caregiver. This may involve avoiding alcohol and decreasing the amount of fat in your diet. °· Eat smaller, more frequent meals. This reduces the amount of digestive juices the pancreas produces. °· Drink enough fluids to keep your urine clear or pale yellow. °· Only take over-the-counter or prescription medicines as directed by your caregiver. °· Avoid drinking alcohol if it caused your condition. °· Do not smoke. °· Get plenty of rest. °· Check your blood sugar at home as directed by your caregiver. °· Keep all follow-up appointments as directed by your caregiver. °SEEK MEDICAL CARE IF:  °· You do not recover as quickly as expected. °· You develop new or worsening symptoms. °· You have persistent pain, weakness, or nausea. °· You recover and then have another episode of pain. °SEEK IMMEDIATE MEDICAL CARE IF:  °· You are unable to eat or keep fluids down. °· Your pain becomes severe. °· You have a fever or persistent symptoms for more than 2 to 3 days. °· You have a fever and your symptoms suddenly get worse. °· Your skin or the white part of your eyes turn yellow (jaundice). °· You develop vomiting. °· You feel dizzy, or you faint. °· Your blood sugar is high (over 300 mg/dL). °MAKE SURE YOU:  °· Understand these instructions. °· Will watch your condition. °· Will get help right away if you are not doing well or get worse. °Document Released: 08/29/2005 Document Revised: 02/28/2012 Document Reviewed: 12/08/2011 °ExitCare® Patient Information ©2014 [employer]. ° °

## 2013-02-26 NOTE — Progress Notes (Addendum)
  Subjective:    Patient ID: Kathleen Blake, female    DOB: 1970-11-22, 42 y.o.   MRN: [id number]  HPI Presents today with complaints of intermittent epigastric pain. Reports onset was in january. She reports being seen at [facility] in January for this pain. Reports she was diagnosed with pancreatitis. She reports they suggested overnight observation and patient declined. She also reports being diagnosed with gall stones. Reports she is breast feeding and hasn't had period since before pregancy and had tubal ligation in December 2013.    Review of Systems  Constitutional: Negative for fever and chills.  Respiratory: Negative for chest tightness, shortness of breath and wheezing.   Cardiovascular: Negative for chest pain and palpitations.  Gastrointestinal: Negative for nausea, vomiting, abdominal pain and blood in stool.  Genitourinary: Negative for dysuria, hematuria and difficulty urinating.  Neurological: Negative for dizziness, weakness and headaches.       Objective:   Physical Exam  Constitutional: She is oriented to person, place, and time. She appears well-developed and well-nourished.  HENT:  Head: Normocephalic and atraumatic.  Cardiovascular: Normal rate, regular rhythm and normal heart sounds.   Pulmonary/Chest: Effort normal and breath sounds normal. No respiratory distress. She exhibits no tenderness.  Abdominal: Soft. Bowel sounds are normal. She exhibits no distension. There is no tenderness. There is no CVA tenderness, no tenderness at McBurney's point and negative Murphy's sign.  Neurological: She is alert and oriented to person, place, and time.  Skin: Skin is warm and dry.  Psychiatric: She has a normal mood and affect.          Assessment & Plan:  1. Hx of pancreatitis - Ambulatory referral to General Surgery - POCT CBC - POCT SEDIMENTATION RATE -Continue working on healthy eating and regular exercise -RTO if symptoms worsen -Patient verbalized  understanding -Coralie Keens, FNP-C

## 2013-02-27 ENCOUNTER — Other Ambulatory Visit: Payer: Self-pay | Admitting: General Practice

## 2013-02-27 ENCOUNTER — Encounter (HOSPITAL_COMMUNITY): Payer: Self-pay

## 2013-02-27 NOTE — Addendum Note (Signed)
Addended byPhilomena Doheny E on: 02/27/2013 07:16 PM   Modules accepted: Orders

## 2013-03-06 ENCOUNTER — Telehealth: Payer: Self-pay

## 2013-03-06 NOTE — Telephone Encounter (Signed)
Medicaid denied CT Abdomen and Pelvis with

## 2013-03-06 NOTE — Telephone Encounter (Signed)
Can refer to GI if still have abdominal pain. thx

## 2013-03-07 ENCOUNTER — Telehealth: Payer: Self-pay

## 2013-03-07 NOTE — Telephone Encounter (Signed)
Returning your call. °

## 2013-03-08 ENCOUNTER — Other Ambulatory Visit: Payer: Self-pay | Admitting: General Practice

## 2013-03-08 DIAGNOSIS — R109 Unspecified abdominal pain: Secondary | ICD-10-CM

## 2013-03-08 NOTE — Telephone Encounter (Signed)
Referral made 

## 2013-03-13 ENCOUNTER — Encounter (INDEPENDENT_AMBULATORY_CARE_PROVIDER_SITE_OTHER): Payer: Self-pay | Admitting: *Deleted

## 2013-04-09 ENCOUNTER — Encounter (INDEPENDENT_AMBULATORY_CARE_PROVIDER_SITE_OTHER): Payer: Self-pay | Admitting: Internal Medicine

## 2013-04-09 ENCOUNTER — Other Ambulatory Visit (INDEPENDENT_AMBULATORY_CARE_PROVIDER_SITE_OTHER): Payer: Self-pay | Admitting: Internal Medicine

## 2013-04-09 ENCOUNTER — Ambulatory Visit (INDEPENDENT_AMBULATORY_CARE_PROVIDER_SITE_OTHER): Payer: Medicaid Other | Admitting: Internal Medicine

## 2013-04-09 VITALS — BP 112/76 | HR 60 | Temp 98.0°F | Ht 67.5 in | Wt 210.9 lb

## 2013-04-09 DIAGNOSIS — R1011 Right upper quadrant pain: Secondary | ICD-10-CM

## 2013-04-09 DIAGNOSIS — I10 Essential (primary) hypertension: Secondary | ICD-10-CM | POA: Insufficient documentation

## 2013-04-09 DIAGNOSIS — G8929 Other chronic pain: Secondary | ICD-10-CM | POA: Insufficient documentation

## 2013-04-09 DIAGNOSIS — G40909 Epilepsy, unspecified, not intractable, without status epilepticus: Secondary | ICD-10-CM | POA: Insufficient documentation

## 2013-04-09 LAB — COMPREHENSIVE METABOLIC PANEL
AST: 17 U/L (ref 0–37)
Alkaline Phosphatase: 55 U/L (ref 39–117)
BUN: 16 mg/dL (ref 6–23)
Calcium: 9.3 mg/dL (ref 8.4–10.5)
Chloride: 102 mEq/L (ref 96–112)
Creat: 1.14 mg/dL — ABNORMAL HIGH (ref 0.50–1.10)
Total Bilirubin: 0.5 mg/dL (ref 0.3–1.2)

## 2013-04-09 NOTE — Patient Instructions (Addendum)
HIDA scan, Cmet, Lipase, When you have this pain again, call our office and I will order a cmet.

## 2013-04-09 NOTE — Progress Notes (Signed)
Subjective:     Patient ID: Kathleen Blake, female   DOB: Apr 18, 1971, 42 y.o.   MRN: [id number]  HPI Referred to our office by [facility] for substernal chest pain radiating into back. She was seen at [facility] January. She thought she was having a heart attack. Atthat time she was 7 weeks postpartum (c-section) She was evaluated and was told she had acute pancreatitis related to cholelithiasis. Marland Kitchen Her lipase was noted to be elevated at 2113.  She tells me while she was in xray for her [place] she was moved around so much that her pain resolved. She declined admission due to her being a new mother and not having a Arts administrator. She left AMA She tells me today that she has pain in her epigastric region and radiates to her rt upper abdomen intermittently.  It occurs every 2 weeks to once a week. This pain is not acid reflux.  The pain occurs sporadically.  She rates the pain at a 6/7 when she hurts. There is no pain at this time. When she has the pain it will last about a day.  Appetite is good. There is weight loss but this is related to post partum. Gave birth August 15, 2012.  No melena or bright red rectal bleeding.  She usually has a BM x 1-2 a day.   10/08/2012 US abdomen: Pancreas not visualized, otherwise negative abdominal US.  CBD measured 5.29mm in greatest diameter. No evidence of gallstones, gallbladder wall thickening, or pericholecystic fluid. Spleen: WNL in size and echotexture. No evidence of ascites  10/08/2012 AST 193, ALT 83, ALP 115, Total bili 0.6. D dimer 0.82,  WBC ct 11.1, H and H 12.5 and 37.55, BUN 19, Creatinine 1.2, Glucose 120 Urinalysis show 20-30 RBC Urine pregnancy negative   Review of Systems see hpi Current Outpatient Prescriptions  Medication Sig Dispense Refill  . carbamazepine (TEGRETOL) 200 MG tablet Take 200 mg by mouth at bedtime.      Marland Kitchen labetalol (NORMODYNE) 200 MG tablet Take 200 mg by mouth daily.       . pantoprazole (PROTONIX) 40 MG tablet Take 40  mg by mouth daily as needed. For heartburn      . Prenatal Vit-Fe Fumarate-FA (PRENATAL MULTIVITAMIN) TABS Take 1 tablet by mouth daily.       No current facility-administered medications for this visit.   Past Medical History  Diagnosis Date  . Anxiety     no meds while preg.  . Polycystic disease, ovaries   . Seizures     March 2013  . Hypertension   . Epilepsy    Past Surgical History  Procedure Laterality Date  . Cesarean section      2008, 2010, 2013  . Cesarean section  08/15/2012    Procedure: CESAREAN SECTION;  Surgeon: Leslie Andrea, MD;  Location: [facility] ORS;  Service: Obstetrics;  Laterality: N/A;  . Tubal ligation  08/15/2012    Procedure: BILATERAL TUBAL LIGATION;  Surgeon: Leslie Andrea, MD;  Location: [facility] ORS;  Service: Obstetrics;  Laterality: Bilateral;   No Known Allergies      Objective:   Physical Exam  Filed Vitals:   04/09/13 1018  BP: 112/76  Pulse: 60  Temp: 98 F (36.7 C)  Height: 5' 7.5" (1.715 m)  Weight: 210 lb 14.4 oz (95.664 kg)   Alert and oriented. Skin warm and dry. Oral mucosa is moist.   . Sclera anicteric, conjunctivae is pink. Thyroid not  enlarged. No cervical lymphadenopathy. Lungs clear. Heart regular rate and rhythm.  Abdomen is soft. Bowel sounds are positive. No hepatomegaly. No abdominal masses felt. No tenderness.  No edema to lower extremities.       Assessment:   Epigastric and rt upper quadrant pain. Hx of elevated lipase. Her transaminases were elevated also in January when she presented to the ED. She possible could have passed a stone.  She has intermittent symptoms now.  I would like to rule out GB disease.    Plan:    HIDA scan Cmet,  Lipase. She was advised if the pain reoccurs to call our office I will order a CMET.

## 2013-04-12 ENCOUNTER — Other Ambulatory Visit (HOSPITAL_COMMUNITY): Payer: Medicaid Other

## 2013-04-15 ENCOUNTER — Encounter (HOSPITAL_COMMUNITY): Payer: Medicaid Other

## 2013-04-17 ENCOUNTER — Encounter (HOSPITAL_COMMUNITY)
Admission: RE | Admit: 2013-04-17 | Discharge: 2013-04-17 | Disposition: A | Discharge status: Home / Self Care | Payer: Medicaid Other | Source: Ambulatory Visit | Attending: Internal Medicine | Admitting: Internal Medicine

## 2013-04-17 DIAGNOSIS — R1011 Right upper quadrant pain: Secondary | ICD-10-CM

## 2013-04-29 ENCOUNTER — Encounter (INDEPENDENT_AMBULATORY_CARE_PROVIDER_SITE_OTHER): Payer: Self-pay

## 2013-07-01 ENCOUNTER — Ambulatory Visit: Payer: Medicaid Other

## 2013-07-02 ENCOUNTER — Encounter: Payer: Medicaid Other | Admitting: Family Medicine

## 2013-08-20 ENCOUNTER — Ambulatory Visit: Payer: Medicaid Other | Admitting: Family Medicine

## 2013-11-04 ENCOUNTER — Other Ambulatory Visit: Payer: Medicaid Other | Admitting: General Practice

## 2013-11-19 ENCOUNTER — Other Ambulatory Visit: Payer: Medicaid Other | Admitting: General Practice

## 2014-01-07 ENCOUNTER — Other Ambulatory Visit: Payer: Medicaid Other | Admitting: General Practice

## 2014-01-07 ENCOUNTER — Ambulatory Visit: Payer: Self-pay | Admitting: General Practice

## 2014-06-05 ENCOUNTER — Telehealth: Payer: Self-pay | Admitting: Family Medicine

## 2014-06-05 ENCOUNTER — Other Ambulatory Visit: Payer: Self-pay | Admitting: Nurse Practitioner

## 2014-06-05 NOTE — Telephone Encounter (Signed)
appt scheduled for Monday and she is going to get pharmacy to give her some until then. Patient was offered appointment for in the am but was unable to come

## 2014-06-09 ENCOUNTER — Encounter: Payer: Self-pay | Admitting: Family

## 2014-06-09 ENCOUNTER — Ambulatory Visit (INDEPENDENT_AMBULATORY_CARE_PROVIDER_SITE_OTHER): Payer: Medicaid Other | Admitting: Family

## 2014-06-09 VITALS — BP 141/94 | HR 64 | Temp 98.6°F | Ht 67.5 in | Wt 215.6 lb

## 2014-06-09 DIAGNOSIS — K219 Gastro-esophageal reflux disease without esophagitis: Secondary | ICD-10-CM

## 2014-06-09 DIAGNOSIS — I10 Essential (primary) hypertension: Secondary | ICD-10-CM

## 2014-06-09 DIAGNOSIS — G40909 Epilepsy, unspecified, not intractable, without status epilepticus: Secondary | ICD-10-CM

## 2014-06-09 MED ORDER — HYDROCHLOROTHIAZIDE 12.5 MG PO TABS: 12.5000 mg | ORAL_TABLET | Freq: Every day | ORAL | Status: DC

## 2014-06-09 MED ORDER — PANTOPRAZOLE SODIUM 40 MG PO TBEC: 40.0000 mg | DELAYED_RELEASE_TABLET | Freq: Every day | ORAL | Status: DC | PRN

## 2014-06-09 MED ORDER — LABETALOL HCL 200 MG PO TABS: 200.0000 mg | ORAL_TABLET | Freq: Every day | ORAL | Status: DC

## 2014-06-09 NOTE — Progress Notes (Signed)
Subjective:    Patient ID: Kathleen Blake, female    DOB: 11-Oct-1970, 43 y.o.   MRN: [id number]  Hypertension This is a recurrent problem. The current episode started more than 1 year ago. The problem has been waxing and waning since onset. The problem is uncontrolled. Pertinent negatives include no anxiety, chest pain, headaches, palpitations, peripheral edema or shortness of breath. Risk factors for coronary artery disease include family history and dyslipidemia. Past treatments include beta blockers. The current treatment provides mild improvement. There is no history of kidney disease, CAD/MI, CVA, heart failure or a thyroid problem. There is no history of sleep apnea.  Gastrophageal Reflux She reports no belching, no chest pain, no choking, no coughing, no heartburn, no hoarse voice or no sore throat. This is a chronic problem. The current episode started more than 1 year ago. The problem occurs rarely. The problem has been waxing and waning. The symptoms are aggravated by certain foods. Pertinent negatives include no fatigue or muscle weakness. She has tried a PPI for the symptoms. The treatment provided moderate relief.  Seizure Pt currently taking Tegretol 291m daily. Pt states her last seizure was in 2006, but does have auras Pt does not have a neurologists in this area.     Review of Systems  Constitutional: Negative.  Negative for fatigue.  HENT: Negative.  Negative for hoarse voice and sore throat.   Eyes: Negative.   Respiratory: Negative.  Negative for cough, choking and shortness of breath.   Cardiovascular: Negative.  Negative for chest pain and palpitations.  Gastrointestinal: Negative.  Negative for heartburn.  Endocrine: Negative.   Genitourinary: Negative.   Musculoskeletal: Negative.  Negative for muscle weakness.  Neurological: Negative.  Negative for headaches.  Hematological: Negative.   Psychiatric/Behavioral: Negative.   All other systems reviewed and are  negative.      Objective:   Physical Exam  Vitals reviewed. Constitutional: She is oriented to person, place, and time. She appears well-developed and well-nourished. No distress.  HENT:  Head: Normocephalic and atraumatic.  Right Ear: External ear normal.  Left Ear: External ear normal.  Nose: Nose normal.  Mouth/Throat: Oropharynx is clear and moist.  Eyes: Pupils are equal, round, and reactive to light.  Neck: Normal range of motion. Neck supple. No thyromegaly present.  Cardiovascular: Normal rate, regular rhythm, normal heart sounds and intact distal pulses.   No murmur heard. Pulmonary/Chest: Effort normal and breath sounds normal. No respiratory distress. She has no wheezes.  Abdominal: Soft. Bowel sounds are normal. She exhibits no distension. There is no tenderness.  Musculoskeletal: Normal range of motion. She exhibits no edema and no tenderness.  Neurological: She is alert and oriented to person, place, and time. She has normal reflexes. No cranial nerve deficit.  Skin: Skin is warm and dry.  Psychiatric: She has a normal mood and affect. Her behavior is normal. Judgment and thought content normal.    BP 141/94  Pulse 64  Temp(Src) 98.6 F (37 C) (Oral)  Ht 5' 7.5" (1.715 m)  Wt 215 lb 9.6 oz (97.796 kg)  BMI 33.25 kg/m2  LMP 06/02/2014  Breastfeeding? Yes       Assessment & Plan:  1. Essential hypertension, benign - CMP14+EGFR - labetalol (NORMODYNE) 200 MG tablet; Take 1 tablet (200 mg total) by mouth daily.  Dispense: 90 tablet; Refill: 3 - hydrochlorothiazide (HYDRODIURIL) 12.5 MG tablet; Take 1 tablet (12.5 mg total) by mouth daily.  Dispense: 90 tablet; Refill: 3  2. Unspecified  epilepsy without mention of intractable epilepsy  3. Gastroesophageal reflux disease, esophagitis presence not specified - pantoprazole (PROTONIX) 40 MG tablet; Take 1 tablet (40 mg total) by mouth daily as needed. For heartburn  Dispense: 90 tablet; Refill: 3   Continue all  meds Labs pending Health Maintenance reviewed Diet and exercise encouraged RTO 2 weeks for blood pressure recheck with nurse  Evelina Dun, FNP

## 2014-06-09 NOTE — Patient Instructions (Signed)
Hypertension Hypertension, commonly called high blood pressure, is when the force of blood pumping through your arteries is too strong. Your arteries are the blood vessels that carry blood from your heart throughout your body. A blood pressure reading consists of a higher number over a lower number, such as 110/72. The higher number (systolic) is the pressure inside your arteries when your heart pumps. The lower number (diastolic) is the pressure inside your arteries when your heart relaxes. Ideally you want your blood pressure below 120/80. Hypertension forces your heart to work harder to pump blood. Your arteries may become narrow or stiff. Having hypertension puts you at risk for heart disease, stroke, and other problems.  RISK FACTORS Some risk factors for high blood pressure are controllable. Others are not.  Risk factors you cannot control include:   Race. You may be at higher risk if you are African American.  Age. Risk increases with age.  Gender. Men are at higher risk than women before age 43 years. After age 43, women are at higher risk than men. Risk factors you can control include:  Not getting enough exercise or physical activity.  Being overweight.  Getting too much fat, sugar, calories, or salt in your diet.  Drinking too much alcohol. SIGNS AND SYMPTOMS Hypertension does not usually cause signs or symptoms. Extremely high blood pressure (hypertensive crisis) may cause headache, anxiety, shortness of breath, and nosebleed. DIAGNOSIS  To check if you have hypertension, your health care provider will measure your blood pressure while you are seated, with your arm held at the level of your heart. It should be measured at least twice using the same arm. Certain conditions can cause a difference in blood pressure between your right and left arms. A blood pressure reading that is higher than normal on one occasion does not mean that you need treatment. If one blood pressure reading  is high, ask your health care provider about having it checked again. TREATMENT  Treating high blood pressure includes making lifestyle changes and possibly taking medicine. Living a healthy lifestyle can help lower high blood pressure. You may need to change some of your habits. Lifestyle changes may include:  Following the DASH diet. This diet is high in fruits, vegetables, and whole grains. It is low in salt, red meat, and added sugars.  Getting at least 2 hours of brisk physical activity every week.  Losing weight if necessary.  Not smoking.  Limiting alcoholic beverages.  Learning ways to reduce stress. If lifestyle changes are not enough to get your blood pressure under control, your health care provider may prescribe medicine. You may need to take more than one. Work closely with your health care provider to understand the risks and benefits. HOME CARE INSTRUCTIONS  Have your blood pressure rechecked as directed by your health care provider.   Take medicines only as directed by your health care provider. Follow the directions carefully. Blood pressure medicines must be taken as prescribed. The medicine does not work as well when you skip doses. Skipping doses also puts you at risk for problems.   Do not smoke.   Monitor your blood pressure at home as directed by your health care provider. SEEK MEDICAL CARE IF:   You think you are having a reaction to medicines taken.  You have recurrent headaches or feel dizzy.  You have swelling in your ankles.  You have trouble with your vision. SEEK IMMEDIATE MEDICAL CARE IF:  You develop a severe headache or confusion.    You have unusual weakness, numbness, or feel faint.  You have severe chest or abdominal pain.  You vomit repeatedly.  You have trouble breathing. MAKE SURE YOU:   Understand these instructions.  Will watch your condition.  Will get help right away if you are not doing well or get worse. Document  Released: 08/29/2005 Document Revised: 01/13/2014 Document Reviewed: 06/21/2013 [facility] Patient Information 2015 [place], [place]. This information is not intended to replace advice given to you by your health care provider. Make sure you discuss any questions you have with your health care provider. Health Maintenance Adopting a healthy lifestyle and getting preventive care can go a long way to promote health and wellness. Talk with your health care provider about what schedule of regular examinations is right for you. This is a good chance for you to check in with your provider about disease prevention and staying healthy. In between checkups, there are plenty of things you can do on your own. Experts have done a lot of research about which lifestyle changes and preventive measures are most likely to keep you healthy. Ask your health care provider for more information. WEIGHT AND DIET  Eat a healthy diet  Be sure to include plenty of vegetables, fruits, low-fat dairy products, and lean protein.  Do not eat a lot of foods high in solid fats, added sugars, or salt.  Get regular exercise. This is one of the most important things you can do for your health.  Most adults should exercise for at least 150 minutes each week. The exercise should increase your heart rate and make you sweat (moderate-intensity exercise).  Most adults should also do strengthening exercises at least twice a week. This is in addition to the moderate-intensity exercise.  Maintain a healthy weight  Body mass index (BMI) is a measurement that can be used to identify possible weight problems. It estimates body fat based on height and weight. Your health care provider can help determine your BMI and help you achieve or maintain a healthy weight.  For females 58 years of age and older:   A BMI below 18.5 is considered underweight.  A BMI of 18.5 to 24.9 is normal.  A BMI of 25 to 29.9 is considered overweight.  A BMI of  30 and above is considered obese.  Watch levels of cholesterol and blood lipids  You should start having your blood tested for lipids and cholesterol at 43 years of age, then have this test every 5 years.  You may need to have your cholesterol levels checked more often if:  Your lipid or cholesterol levels are high.  You are older than 43 years of age.  You are at high risk for heart disease.  CANCER SCREENING   Lung Cancer  Lung cancer screening is recommended for adults 21-41 years old who are at high risk for lung cancer because of a history of smoking.  A yearly low-dose CT scan of the lungs is recommended for people who:  Currently smoke.  Have quit within the past 15 years.  Have at least a 30-pack-year history of smoking. A pack year is smoking an average of one pack of cigarettes a day for 1 year.  Yearly screening should continue until it has been 15 years since you quit.  Yearly screening should stop if you develop a health problem that would prevent you from having lung cancer treatment.  Breast Cancer  Practice breast self-awareness. This means understanding how your breasts normally appear and  feel.  It also means doing regular breast self-exams. Let your health care provider know about any changes, no matter how small.  If you are in your 20s or 30s, you should have a clinical breast exam (CBE) by a health care provider every 1-3 years as part of a regular health exam.  If you are 75 or older, have a CBE every year. Also consider having a breast X-ray (mammogram) every year.  If you have a family history of breast cancer, talk to your health care provider about genetic screening.  If you are at high risk for breast cancer, talk to your health care provider about having an MRI and a mammogram every year.  Breast cancer gene (BRCA) assessment is recommended for women who have family members with BRCA-related cancers. BRCA-related cancers  include:  Breast.  Ovarian.  Tubal.  Peritoneal cancers.  Results of the assessment will determine the need for genetic counseling and BRCA1 and BRCA2 testing. Cervical Cancer Routine pelvic examinations to screen for cervical cancer are no longer recommended for nonpregnant women who are considered low risk for cancer of the pelvic organs (ovaries, uterus, and vagina) and who do not have symptoms. A pelvic examination may be necessary if you have symptoms including those associated with pelvic infections. Ask your health care provider if a screening pelvic exam is right for you.   The Pap test is the screening test for cervical cancer for women who are considered at risk.  If you had a hysterectomy for a problem that was not cancer or a condition that could lead to cancer, then you no longer need Pap tests.  If you are older than 65 years, and you have had normal Pap tests for the past 10 years, you no longer need to have Pap tests.  If you have had past treatment for cervical cancer or a condition that could lead to cancer, you need Pap tests and screening for cancer for at least 20 years after your treatment.  If you no longer get a Pap test, assess your risk factors if they change (such as having a new sexual partner). This can affect whether you should start being screened again.  Some women have medical problems that increase their chance of getting cervical cancer. If this is the case for you, your health care provider may recommend more frequent screening and Pap tests.  The human papillomavirus (HPV) test is another test that may be used for cervical cancer screening. The HPV test looks for the virus that can cause cell changes in the cervix. The cells collected during the Pap test can be tested for HPV.  The HPV test can be used to screen women 62 years of age and older. Getting tested for HPV can extend the interval between normal Pap tests from three to five years.  An HPV  test also should be used to screen women of any age who have unclear Pap test results.  After 43 years of age, women should have HPV testing as often as Pap tests.  Colorectal Cancer  This type of cancer can be detected and often prevented.  Routine colorectal cancer screening usually begins at 43 years of age and continues through 43 years of age.  Your health care provider may recommend screening at an earlier age if you have risk factors for colon cancer.  Your health care provider may also recommend using home test kits to check for hidden blood in the stool.  A small camera  at the end of a tube can be used to examine your colon directly (sigmoidoscopy or colonoscopy). This is done to check for the earliest forms of colorectal cancer.  Routine screening usually begins at age 28.  Direct examination of the colon should be repeated every 5-10 years through 43 years of age. However, you may need to be screened more often if early forms of precancerous polyps or small growths are found. Skin Cancer  Check your skin from head to toe regularly.  Tell your health care provider about any new moles or changes in moles, especially if there is a change in a mole's shape or color.  Also tell your health care provider if you have a mole that is larger than the size of a pencil eraser.  Always use sunscreen. Apply sunscreen liberally and repeatedly throughout the day.  Protect yourself by wearing long sleeves, pants, a wide-brimmed hat, and sunglasses whenever you are outside. HEART DISEASE, DIABETES, AND HIGH BLOOD PRESSURE   Have your blood pressure checked at least every 1-2 years. High blood pressure causes heart disease and increases the risk of stroke.  If you are between 20 years and 33 years old, ask your health care provider if you should take aspirin to prevent strokes.  Have regular diabetes screenings. This involves taking a blood sample to check your fasting blood sugar  level.  If you are at a normal weight and have a low risk for diabetes, have this test once every three years after 43 years of age.  If you are overweight and have a high risk for diabetes, consider being tested at a younger age or more often. PREVENTING INFECTION  Hepatitis B  If you have a higher risk for hepatitis B, you should be screened for this virus. You are considered at high risk for hepatitis B if:  You were born in a country where hepatitis B is common. Ask your health care provider which countries are considered high risk.  Your parents were born in a high-risk country, and you have not been immunized against hepatitis B (hepatitis B vaccine).  You have HIV or AIDS.  You use needles to inject street drugs.  You live with someone who has hepatitis B.  You have had sex with someone who has hepatitis B.  You get hemodialysis treatment.  You take certain medicines for conditions, including cancer, organ transplantation, and autoimmune conditions. Hepatitis C  Blood testing is recommended for:  Everyone born from [birth year] through 1965.  Anyone with known risk factors for hepatitis C. Sexually transmitted infections (STIs)  You should be screened for sexually transmitted infections (STIs) including gonorrhea and chlamydia if:  You are sexually active and are younger than 43 years of age.  You are older than 43 years of age and your health care provider tells you that you are at risk for this type of infection.  Your sexual activity has changed since you were last screened and you are at an increased risk for chlamydia or gonorrhea. Ask your health care provider if you are at risk.  If you do not have HIV, but are at risk, it may be recommended that you take a prescription medicine daily to prevent HIV infection. This is called pre-exposure prophylaxis (PrEP). You are considered at risk if:  You are sexually active and do not regularly use condoms or know the HIV status  of your partner(s).  You take drugs by injection.  You are sexually active with a partner  who has HIV. Talk with your health care provider about whether you are at high risk of being infected with HIV. If you choose to begin PrEP, you should first be tested for HIV. You should then be tested every 3 months for as long as you are taking PrEP.  PREGNANCY   If you are premenopausal and you may become pregnant, ask your health care provider about preconception counseling.  If you may become pregnant, take 400 to 800 micrograms (mcg) of folic acid every day.  If you want to prevent pregnancy, talk to your health care provider about birth control (contraception). OSTEOPOROSIS AND MENOPAUSE   Osteoporosis is a disease in which the bones lose minerals and strength with aging. This can result in serious bone fractures. Your risk for osteoporosis can be identified using a bone density scan.  If you are 38 years of age or older, or if you are at risk for osteoporosis and fractures, ask your health care provider if you should be screened.  Ask your health care provider whether you should take a calcium or vitamin D supplement to lower your risk for osteoporosis.  Menopause may have certain physical symptoms and risks.  Hormone replacement therapy may reduce some of these symptoms and risks. Talk to your health care provider about whether hormone replacement therapy is right for you.  HOME CARE INSTRUCTIONS   Schedule regular health, dental, and eye exams.  Stay current with your immunizations.   Do not use any tobacco products including cigarettes, chewing tobacco, or electronic cigarettes.  If you are pregnant, do not drink alcohol.  If you are breastfeeding, limit how much and how often you drink alcohol.  Limit alcohol intake to no more than 1 drink per day for nonpregnant women. One drink equals 12 ounces of beer, 5 ounces of wine, or 1 ounces of hard liquor.  Do not use street  drugs.  Do not share needles.  Ask your health care provider for help if you need support or information about quitting drugs.  Tell your health care provider if you often feel depressed.  Tell your health care provider if you have ever been abused or do not feel safe at home. Document Released: 03/14/2011 Document Revised: 01/13/2014 Document Reviewed: 07/31/2013 [facility] Patient Information 2015 [place], [place]. This information is not intended to replace advice given to you by your health care provider. Make sure you discuss any questions you have with your health care provider.

## 2014-06-10 LAB — CMP14+EGFR
ALBUMIN: 4.4 g/dL (ref 3.5–5.5)
ALK PHOS: 52 IU/L (ref 39–117)
ALT: 26 IU/L (ref 0–32)
AST: 16 IU/L (ref 0–40)
Albumin/Globulin Ratio: 2 (ref 1.1–2.5)
BILIRUBIN TOTAL: 0.2 mg/dL (ref 0.0–1.2)
BUN / CREAT RATIO: 14 (ref 9–23)
BUN: 18 mg/dL (ref 6–24)
CHLORIDE: 98 mmol/L (ref 97–108)
CO2: 24 mmol/L (ref 18–29)
Calcium: 9.1 mg/dL (ref 8.7–10.2)
Creatinine, Ser: 1.27 mg/dL — ABNORMAL HIGH (ref 0.57–1.00)
GFR calc Af Amer: 60 mL/min/{1.73_m2} (ref >59)
GFR calc non Af Amer: 52 mL/min/{1.73_m2} — ABNORMAL LOW (ref >59)
GLUCOSE: 106 mg/dL — AB (ref 65–99)
Globulin, Total: 2.2 g/dL (ref 1.5–4.5)
POTASSIUM: 4 mmol/L (ref 3.5–5.2)
Sodium: 139 mmol/L (ref 134–144)
TOTAL PROTEIN: 6.6 g/dL (ref 6.0–8.5)

## 2014-06-25 ENCOUNTER — Ambulatory Visit (INDEPENDENT_AMBULATORY_CARE_PROVIDER_SITE_OTHER): Payer: Medicaid Other | Admitting: *Deleted

## 2014-06-25 VITALS — BP 121/79 | HR 77

## 2014-06-25 DIAGNOSIS — I10 Essential (primary) hypertension: Secondary | ICD-10-CM

## 2014-06-25 NOTE — Patient Instructions (Signed)

## 2014-06-25 NOTE — Progress Notes (Signed)
Patient ID: Kathleen ColtRebekah L Frisby, female   DOB: [birth year], 43 y.o.   MRN: [id number] Patient comes in today for a BP nurse recheck. Patients BP 121/79  Pulse 77  LMP 06/02/2014 Patient aware we will send over to the provider and if any changes needed to be made we will contact patient.

## 2014-06-26 ENCOUNTER — Telehealth: Payer: Self-pay | Admitting: Family

## 2014-07-07 ENCOUNTER — Telehealth: Payer: Self-pay | Admitting: *Deleted

## 2014-07-07 NOTE — Telephone Encounter (Signed)
Discussed lab results again with patient.

## 2014-07-07 NOTE — Telephone Encounter (Signed)
Call given to nurse °

## 2014-07-09 NOTE — Telephone Encounter (Signed)
Spoke with patient who is confused about her kidney functions. 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 I have reviewed chart and cannot find anything about a non functioning kidney. Could you please call and discuss with patient. She is a patient of Neysa BonitoChristy

## 2014-07-10 NOTE — Telephone Encounter (Signed)
Discussed lab results with patient and told her that her creatine is slightly elevated and need to avoid daily use of NSAIDS and ibuprofen.

## 2014-07-14 ENCOUNTER — Encounter: Payer: Self-pay | Admitting: Family

## 2014-08-02 ENCOUNTER — Ambulatory Visit (INDEPENDENT_AMBULATORY_CARE_PROVIDER_SITE_OTHER): Payer: Medicaid Other | Admitting: Nurse Practitioner

## 2014-08-02 VITALS — BP 108/70 | HR 67 | Temp 98.7°F | Ht 67.5 in | Wt 218.0 lb

## 2014-08-02 DIAGNOSIS — J209 Acute bronchitis, unspecified: Secondary | ICD-10-CM

## 2014-08-02 MED ORDER — HYDROCODONE-HOMATROPINE 5-1.5 MG/5ML PO SYRP: 5.0000 mL | ORAL_SOLUTION | Freq: Three times a day (TID) | ORAL | Status: DC | PRN

## 2014-08-02 MED ORDER — AMOXICILLIN 875 MG PO TABS: 875.0000 mg | ORAL_TABLET | Freq: Two times a day (BID) | ORAL | Status: DC

## 2014-08-02 NOTE — Patient Instructions (Signed)

## 2014-08-02 NOTE — Progress Notes (Signed)
   Subjective:    Patient ID: Kathleen Blake, female    DOB: [birth year], 43 y.o.   MRN: [id number]  HPI Patient in today c/o cough and congestion- has had for 2-3 weeks- has tried OTC meds with no success.    Review of Systems  Constitutional: Positive for fever (low grade last night) and fatigue. Negative for chills.  HENT: Positive for congestion, rhinorrhea and sinus pressure. Negative for sore throat, trouble swallowing and voice change.   Respiratory: Positive for cough (nonproductive).   Cardiovascular: Negative.   Gastrointestinal: Negative.   Neurological: Positive for headaches.  Psychiatric/Behavioral: Negative.   All other systems reviewed and are negative.      Objective:   Physical Exam  Constitutional: She is oriented to person, place, and time. She appears well-developed and well-nourished. No distress.  HENT:  Right Ear: Hearing, tympanic membrane, external ear and ear canal normal.  Left Ear: Hearing, tympanic membrane, external ear and ear canal normal.  Nose: Mucosal edema and rhinorrhea present. Right sinus exhibits no maxillary sinus tenderness and no frontal sinus tenderness. Left sinus exhibits no maxillary sinus tenderness and no frontal sinus tenderness.  Mouth/Throat: Uvula is midline, oropharynx is clear and moist and mucous membranes are normal.  Eyes: Pupils are equal, round, and reactive to light.  Neck: Normal range of motion. Neck supple.  Cardiovascular: Normal rate, regular rhythm and normal heart sounds.   Pulmonary/Chest: Effort normal and breath sounds normal. No respiratory distress. She has no wheezes. She has no rales.  Deep tight dry cough  Abdominal: Soft.  Lymphadenopathy:    She has no cervical adenopathy.  Neurological: She is alert and oriented to person, place, and time.  Skin: Skin is warm and dry.  Psychiatric: She has a normal mood and affect. Her behavior is normal. Judgment and thought content normal.   BP 108/70 mmHg   Pulse 67  Temp(Src) 98.7 F (37.1 C) (Oral)  Ht 5' 7.5" (1.715 m)  Wt 218 lb (98.884 kg)  BMI 33.62 kg/m2  LMP 07/19/2014        Assessment & Plan:  1. Acute bronchitis, unspecified organism 1. Take meds as prescribed 2. Use a cool mist humidifier especially during the winter months and when heat has been humid. 3. Use saline nose sprays frequently 4. Saline irrigations of the nose can be very helpful if done frequently.  * 4X daily for 1 week*  * Use of a nettie pot can be helpful with this. Follow directions with this* 5. Drink plenty of fluids 6. Keep thermostat turn down low 7.For any cough or congestion  Use plain Mucinex- regular strength or max strength is fine   * Children- consult with Pharmacist for dosing 8. For fever or aces or pains- take tylenol or ibuprofen appropriate for age and weight.  * for fevers greater than 101 orally you may alternate ibuprofen and tylenol every  3 hours.   - amoxicillin (AMOXIL) 875 MG tablet; Take 1 tablet (875 mg total) by mouth 2 (two) times daily.  Dispense: 20 tablet; Refill: 0 - HYDROcodone-homatropine (HYCODAN) 5-1.5 MG/5ML syrup; Take 5 mLs by mouth every 8 (eight) hours as needed for cough.  Dispense: 120 mL; Refill: 0  Mary-Margaret Daphine DeutscherMartin, FNP

## 2014-09-17 ENCOUNTER — Ambulatory Visit (INDEPENDENT_AMBULATORY_CARE_PROVIDER_SITE_OTHER): Payer: Medicaid Other | Admitting: Family Medicine

## 2014-09-17 ENCOUNTER — Encounter: Payer: Self-pay | Admitting: Family Medicine

## 2014-09-17 VITALS — BP 134/89 | HR 76 | Temp 98.9°F | Ht 67.5 in

## 2014-09-17 DIAGNOSIS — J209 Acute bronchitis, unspecified: Secondary | ICD-10-CM

## 2014-09-17 MED ORDER — HYDROCODONE-HOMATROPINE 5-1.5 MG/5ML PO SYRP: 5.0000 mL | ORAL_SOLUTION | Freq: Three times a day (TID) | ORAL | Status: DC | PRN

## 2014-09-17 MED ORDER — AMOXICILLIN 875 MG PO TABS: 875.0000 mg | ORAL_TABLET | Freq: Two times a day (BID) | ORAL | Status: DC

## 2014-09-17 NOTE — Progress Notes (Signed)
   Subjective:    Patient ID: Kathleen Blake, female    DOB: 1971/03/29, 44 y.o.   MRN: [id number]  HPI Patient c/o uri sx's and cough.  Review of Systems  Constitutional: Negative for fever.  HENT: Negative for ear pain.   Eyes: Negative for discharge.  Respiratory: Negative for cough.   Cardiovascular: Negative for chest pain.  Gastrointestinal: Negative for abdominal distention.  Endocrine: Negative for polyuria.  Genitourinary: Negative for difficulty urinating.  Musculoskeletal: Negative for gait problem and neck pain.  Skin: Negative for color change and rash.  Neurological: Negative for speech difficulty and headaches.  Psychiatric/Behavioral: Negative for agitation.       Objective:    BP 134/89 mmHg  Pulse 76  Temp(Src) 98.9 F (37.2 C) (Oral)  Ht 5' 7.5" (1.715 m)  LMP 09/10/2014 Physical Exam  Constitutional: She is oriented to person, place, and time. She appears well-developed and well-nourished.  HENT:  Head: Normocephalic and atraumatic.  Mouth/Throat: Oropharynx is clear and moist.  Eyes: Pupils are equal, round, and reactive to light.  Neck: Normal range of motion. Neck supple.  Cardiovascular: Normal rate and regular rhythm.   No murmur heard. Pulmonary/Chest: Effort normal and breath sounds normal.  Abdominal: Soft. Bowel sounds are normal. There is no tenderness.  Neurological: She is alert and oriented to person, place, and time.  Skin: Skin is warm and dry.  Psychiatric: She has a normal mood and affect.          Assessment & Plan:     ICD-9-CM ICD-10-CM   1. Acute bronchitis, unspecified organism 466.0 J20.9 HYDROcodone-homatropine (HYCODAN) 5-1.5 MG/5ML syrup     amoxicillin (AMOXIL) 875 MG tablet     Return if symptoms worsen or fail to improve.  Deatra CanterWilliam J Orenthal Debski FNP

## 2014-09-24 ENCOUNTER — Encounter (INDEPENDENT_AMBULATORY_CARE_PROVIDER_SITE_OTHER): Payer: Self-pay

## 2014-09-24 ENCOUNTER — Ambulatory Visit (INDEPENDENT_AMBULATORY_CARE_PROVIDER_SITE_OTHER): Payer: Medicaid Other | Admitting: Family Medicine

## 2014-09-24 ENCOUNTER — Ambulatory Visit (INDEPENDENT_AMBULATORY_CARE_PROVIDER_SITE_OTHER): Payer: Medicaid Other

## 2014-09-24 VITALS — BP 132/87 | HR 66 | Temp 97.3°F | Ht 67.5 in | Wt 220.8 lb

## 2014-09-24 DIAGNOSIS — J189 Pneumonia, unspecified organism: Secondary | ICD-10-CM

## 2014-09-24 DIAGNOSIS — R059 Cough, unspecified: Secondary | ICD-10-CM

## 2014-09-24 DIAGNOSIS — J209 Acute bronchitis, unspecified: Secondary | ICD-10-CM

## 2014-09-24 DIAGNOSIS — R05 Cough: Secondary | ICD-10-CM

## 2014-09-24 MED ORDER — ALBUTEROL SULFATE HFA 108 (90 BASE) MCG/ACT IN AERS: 2.0000 | INHALATION_SPRAY | Freq: Four times a day (QID) | RESPIRATORY_TRACT | Status: DC | PRN

## 2014-09-24 MED ORDER — METHYLPREDNISOLONE (PAK) 4 MG PO TABS: ORAL_TABLET | ORAL | Status: DC

## 2014-09-24 MED ORDER — LEVALBUTEROL HCL 1.25 MG/0.5ML IN NEBU
1.2500 mg | INHALATION_SOLUTION | Freq: Once | RESPIRATORY_TRACT | Status: AC
  Administered 2014-09-24: 1.25 mg via RESPIRATORY_TRACT

## 2014-09-24 MED ORDER — LEVOFLOXACIN 500 MG PO TABS: 500.0000 mg | ORAL_TABLET | Freq: Every day | ORAL | Status: DC

## 2014-09-24 MED ORDER — HYDROCODONE-HOMATROPINE 5-1.5 MG/5ML PO SYRP: 5.0000 mL | ORAL_SOLUTION | Freq: Three times a day (TID) | ORAL | Status: DC | PRN

## 2014-09-24 MED ORDER — METHYLPREDNISOLONE ACETATE 80 MG/ML IJ SUSP
80.0000 mg | Freq: Once | INTRAMUSCULAR | Status: AC
  Administered 2014-09-24: 80 mg via INTRAMUSCULAR

## 2014-09-24 NOTE — Progress Notes (Signed)
   Subjective:    Patient ID: Kathleen Blake, female    DOB: 02-28-1971, 44 y.o.   MRN: [id number]  HPI Patient is here for c/o uri sx's and persistent cough.  She was seen a few weeks ago and tx'd with amoxicillin.  Review of Systems  Constitutional: Negative for fever.  HENT: Negative for ear pain.   Eyes: Negative for discharge.  Respiratory: Negative for cough.   Cardiovascular: Negative for chest pain.  Gastrointestinal: Negative for abdominal distention.  Endocrine: Negative for polyuria.  Genitourinary: Negative for difficulty urinating.  Musculoskeletal: Negative for gait problem and neck pain.  Skin: Negative for color change and rash.  Neurological: Negative for speech difficulty and headaches.  Psychiatric/Behavioral: Negative for agitation.       Objective:    BP 132/87 mmHg  Pulse 66  Temp(Src) 97.3 F (36.3 C) (Oral)  Ht 5' 7.5" (1.715 m)  Wt 220 lb 12.8 oz (100.154 kg)  BMI 34.05 kg/m2  LMP 09/10/2014 Physical Exam  Constitutional: She is oriented to person, place, and time. She appears well-developed and well-nourished.  HENT:  Head: Normocephalic and atraumatic.  Mouth/Throat: Oropharynx is clear and moist.  Eyes: Pupils are equal, round, and reactive to light.  Neck: Normal range of motion. Neck supple.  Cardiovascular: Normal rate and regular rhythm.   No murmur heard. Pulmonary/Chest: Effort normal and breath sounds normal.  Abdominal: Soft. Bowel sounds are normal. There is no tenderness.  Neurological: She is alert and oriented to person, place, and time.  Skin: Skin is warm and dry.  Psychiatric: She has a normal mood and affect.    CXR - Possible left lower lobe infiltrate Prelimnary reading by Angeline SlimWilliam Malik Ruffino,FNP      Assessment & Plan:     ICD-9-CM ICD-10-CM   1. Cough 786.2 R05 DG Chest 2 View     methylPREDNISolone acetate (DEPO-MEDROL) injection 80 mg     levalbuterol (XOPENEX) nebulizer solution 1.25 mg     levofloxacin  (LEVAQUIN) 500 MG tablet     methylPREDNIsolone (MEDROL DOSPACK) 4 MG tablet     HYDROcodone-homatropine (HYCODAN) 5-1.5 MG/5ML syrup     albuterol (PROVENTIL HFA;VENTOLIN HFA) 108 (90 BASE) MCG/ACT inhaler  2. Acute bronchitis, unspecified organism 466.0 J20.9 levofloxacin (LEVAQUIN) 500 MG tablet     methylPREDNIsolone (MEDROL DOSPACK) 4 MG tablet     HYDROcodone-homatropine (HYCODAN) 5-1.5 MG/5ML syrup     albuterol (PROVENTIL HFA;VENTOLIN HFA) 108 (90 BASE) MCG/ACT inhaler  3. CAP (community acquired pneumonia) 486 J18.9      No Follow-up on file.  Deatra CanterWilliam J Nevada Kirchner FNP

## 2014-09-26 ENCOUNTER — Encounter: Payer: Self-pay | Admitting: Family Medicine

## 2014-09-26 ENCOUNTER — Telehealth: Payer: Self-pay | Admitting: Family Medicine

## 2014-09-26 ENCOUNTER — Ambulatory Visit (INDEPENDENT_AMBULATORY_CARE_PROVIDER_SITE_OTHER): Payer: Medicaid Other | Admitting: Family Medicine

## 2014-09-26 VITALS — BP 125/94 | HR 100 | Temp 97.7°F | Ht 67.5 in | Wt 217.0 lb

## 2014-09-26 DIAGNOSIS — J206 Acute bronchitis due to rhinovirus: Secondary | ICD-10-CM

## 2014-09-26 NOTE — Progress Notes (Signed)
   Subjective:    Patient ID: Kathleen Blake, female    DOB: April 11, 1971, 44 y.o.   MRN: [id number]  HPI Patient is c/o persistent cough and URI sx's.  She has been taking levaquin abx's, medrol dose pack, albuterol MDI and she is still having cough.  Review of Systems  Constitutional: Negative for fever.  HENT: Negative for ear pain.   Eyes: Negative for discharge.  Respiratory: Negative for cough.   Cardiovascular: Negative for chest pain.  Gastrointestinal: Negative for abdominal distention.  Endocrine: Negative for polyuria.  Genitourinary: Negative for difficulty urinating.  Musculoskeletal: Negative for gait problem and neck pain.  Skin: Negative for color change and rash.  Neurological: Negative for speech difficulty and headaches.  Psychiatric/Behavioral: Negative for agitation.       Objective:    BP 125/94 mmHg  Pulse 100  Temp(Src) 97.7 F (36.5 C) (Oral)  Ht 5' 7.5" (1.715 m)  Wt 217 lb (98.431 kg)  BMI 33.47 kg/m2  LMP 09/10/2014 Physical Exam  Constitutional: She is oriented to person, place, and time. She appears well-developed and well-nourished.  HENT:  Head: Normocephalic and atraumatic.  Mouth/Throat: Oropharynx is clear and moist.  Eyes: Pupils are equal, round, and reactive to light.  Neck: Normal range of motion. Neck supple.  Cardiovascular: Normal rate and regular rhythm.   No murmur heard. Pulmonary/Chest: Effort normal and breath sounds normal.  Abdominal: Soft. Bowel sounds are normal. There is no tenderness.  Neurological: She is alert and oriented to person, place, and time.  Skin: Skin is warm and dry.  Psychiatric: She has a normal mood and affect.          Assessment & Plan:     ICD-9-CM ICD-10-CM   1. Acute bronchitis due to Rhinovirus 466.0 J20.6    079.3     Levaquin 500mg  one po qd and hycodan syrup #120 ml and Push po fluids, rest, tylenol and motrin otc prn as directed for fever, arthralgias, and myalgias.  Follow up  prn if sx's continue or persist.  No Follow-up on file.  Deatra CanterWilliam J [place] FNP

## 2014-09-26 NOTE — Telephone Encounter (Signed)
Appointment given for 5:15 tonight

## 2014-09-30 ENCOUNTER — Other Ambulatory Visit: Payer: Self-pay | Admitting: Family Medicine

## 2014-10-01 ENCOUNTER — Telehealth: Payer: Self-pay | Admitting: Family

## 2014-10-01 ENCOUNTER — Other Ambulatory Visit: Payer: Self-pay | Admitting: Family

## 2014-10-01 MED ORDER — CARBAMAZEPINE 200 MG PO TABS: 200.0000 mg | ORAL_TABLET | Freq: Every day | ORAL | Status: DC

## 2014-10-01 NOTE — Telephone Encounter (Signed)
RX refill sent to pharmacy.

## 2014-10-03 ENCOUNTER — Other Ambulatory Visit: Payer: Self-pay | Admitting: Family Medicine

## 2014-11-08 ENCOUNTER — Other Ambulatory Visit: Payer: Self-pay | Admitting: Family

## 2014-11-16 ENCOUNTER — Other Ambulatory Visit: Payer: Self-pay | Admitting: Family

## 2014-11-26 ENCOUNTER — Other Ambulatory Visit: Payer: Self-pay | Admitting: Family

## 2014-12-08 ENCOUNTER — Telehealth: Payer: Self-pay | Admitting: Family

## 2014-12-08 NOTE — Telephone Encounter (Signed)
Patient has PCOS and wants to see about getting back on metformin and something for weight loss. Appointment given for 3/30 @ 4:10

## 2014-12-10 ENCOUNTER — Encounter: Payer: Self-pay | Admitting: Family

## 2014-12-10 ENCOUNTER — Ambulatory Visit (INDEPENDENT_AMBULATORY_CARE_PROVIDER_SITE_OTHER): Payer: BC Managed Care – PPO | Admitting: Family

## 2014-12-10 VITALS — BP 134/85 | HR 72 | Temp 97.9°F | Ht 67.5 in | Wt 225.0 lb

## 2014-12-10 DIAGNOSIS — Z713 Dietary counseling and surveillance: Secondary | ICD-10-CM | POA: Diagnosis not present

## 2014-12-10 DIAGNOSIS — E282 Polycystic ovarian syndrome: Secondary | ICD-10-CM | POA: Diagnosis not present

## 2014-12-10 MED ORDER — METFORMIN HCL 1000 MG PO TABS: 1000.0000 mg | ORAL_TABLET | Freq: Two times a day (BID) | ORAL | Status: DC

## 2014-12-10 MED ORDER — PHENTERMINE HCL 37.5 MG PO CAPS: 37.5000 mg | ORAL_CAPSULE | ORAL | Status: DC

## 2014-12-10 NOTE — Patient Instructions (Signed)
Exercise to Lose Weight Exercise and a healthy diet may help you lose weight. Your doctor may suggest specific exercises. EXERCISE IDEAS AND TIPS  Choose low-cost things you enjoy doing, such as walking, bicycling, or exercising to workout videos.  Take stairs instead of the elevator.  Walk during your lunch break.  Park your car further away from work or school.  Go to a gym or an exercise class.  Start with 5 to 10 minutes of exercise each day. Build up to 30 minutes of exercise 4 to 6 days a week.  Wear shoes with good support and comfortable clothes.  Stretch before and after working out.  Work out until you breathe harder and your heart beats faster.  Drink extra water when you exercise.  Do not do so much that you hurt yourself, feel dizzy, or get very short of breath. Exercises that burn about 150 calories:  Running 1  miles in 15 minutes.  Playing volleyball for 45 to 60 minutes.  Washing and waxing a car for 45 to 60 minutes.  Playing touch football for 45 minutes.  Walking 1  miles in 35 minutes.  Pushing a stroller 1  miles in 30 minutes.  Playing basketball for 30 minutes.  Raking leaves for 30 minutes.  Bicycling 5 miles in 30 minutes.  Walking 2 miles in 30 minutes.  Dancing for 30 minutes.  Shoveling snow for 15 minutes.  Swimming laps for 20 minutes.  Walking up stairs for 15 minutes.  Bicycling 4 miles in 15 minutes.  Gardening for 30 to 45 minutes.  Jumping rope for 15 minutes.  Washing windows or floors for 45 to 60 minutes. Document Released: 10/01/2010 Document Revised: 11/21/2011 Document Reviewed: 10/01/2010 [employer] Patient Information 2015 [employer]. This information is not intended to replace advice given to you by your health care provider. Make sure you discuss any questions you have with your health care provider. Calorie Counting for Weight Loss Calories are energy you get from the things you eat and drink. Your  body uses this energy to keep you going throughout the day. The number of calories you eat affects your weight. When you eat more calories than your body needs, your body stores the extra calories as fat. When you eat fewer calories than your body needs, your body burns fat to get the energy it needs. Calorie counting means keeping track of how many calories you eat and drink each day. If you make sure to eat fewer calories than your body needs, you should lose weight. In order for calorie counting to work, you will need to eat the number of calories that are right for you in a day to lose a healthy amount of weight per week. A healthy amount of weight to lose per week is usually 1-2 lb (0.5-0.9 kg). A dietitian can determine how many calories you need in a day and give you suggestions on how to reach your calorie goal.  WHAT IS MY MY PLAN? My goal is to have __________ calories per day.  If I have this many calories per day, I should lose around __________ pounds per week. WHAT DO I NEED TO KNOW ABOUT CALORIE COUNTING? In order to meet your daily calorie goal, you will need to:  Find out how many calories are in each food you would like to eat. Try to do this before you eat.  Decide how much of the food you can eat.  Write down what you ate and   how many calories it had. Doing this is called keeping a food log. WHERE DO I FIND CALORIE INFORMATION? The number of calories in a food can be found on a Nutrition Facts label. Note that all the information on a label is based on a specific serving of the food. If a food does not have a Nutrition Facts label, try to look up the calories online or ask your dietitian for help. HOW DO I DECIDE HOW MUCH TO EAT? To decide how much of the food you can eat, you will need to consider both the number of calories in one serving and the size of one serving. This information can be found on the Nutrition Facts label. If a food does not have a Nutrition Facts label, look  up the information online or ask your dietitian for help. Remember that calories are listed per serving. If you choose to have more than one serving of a food, you will have to multiply the calories per serving by the amount of servings you plan to eat. For example, the label on a package of bread might say that a serving size is 1 slice and that there are 90 calories in a serving. If you eat 1 slice, you will have eaten 90 calories. If you eat 2 slices, you will have eaten 180 calories. HOW DO I KEEP A FOOD LOG? After each meal, record the following information in your food log:  What you ate.  How much of it you ate.  How many calories it had.  Then, add up your calories. Keep your food log near you, such as in a small notebook in your pocket. Another option is to use a mobile app or website. Some programs will calculate calories for you and show you how many calories you have left each time you add an item to the log. WHAT ARE SOME CALORIE COUNTING TIPS?  Use your calories on foods and drinks that will fill you up and not leave you hungry. Some examples of this include foods like nuts and nut butters, vegetables, lean proteins, and high-fiber foods (more than 5 g fiber per serving).  Eat nutritious foods and avoid empty calories. Empty calories are calories you get from foods or beverages that do not have many nutrients, such as candy and soda. It is better to have a nutritious high-calorie food (such as an avocado) than a food with few nutrients (such as a bag of chips).  Know how many calories are in the foods you eat most often. This way, you do not have to look up how many calories they have each time you eat them.  Look out for foods that may seem like low-calorie foods but are really high-calorie foods, such as baked goods, soda, and fat-free candy.  Pay attention to calories in drinks. Drinks such as sodas, specialty coffee drinks, alcohol, and juices have a lot of calories yet do  not fill you up. Choose low-calorie drinks like water and diet drinks.  Focus your calorie counting efforts on higher calorie items. Logging the calories in a garden salad that contains only vegetables is less important than calculating the calories in a milk shake.  Find a way of tracking calories that works for you. Get creative. Most people who are successful find ways to keep track of how much they eat in a day, even if they do not count every calorie. WHAT ARE SOME PORTION CONTROL TIPS?  Know how many calories are in a   serving. This will help you know how many servings of a certain food you can have.  Use a measuring cup to measure serving sizes. This is helpful when you start out. With time, you will be able to estimate serving sizes for some foods.  Take some time to put servings of different foods on your favorite plates, bowls, and cups so you know what a serving looks like.  Try not to eat straight from a bag or box. Doing this can lead to overeating. Put the amount you would like to eat in a cup or on a plate to make sure you are eating the right portion.  Use smaller plates, glasses, and bowls to prevent overeating. This is a quick and easy way to practice portion control. If your plate is smaller, less food can fit on it.  Try not to multitask while eating, such as watching TV or using your computer. If it is time to eat, sit down at a table and enjoy your food. Doing this will help you to start recognizing when you are full. It will also make you more aware of what and how much you are eating. HOW CAN I CALORIE COUNT WHEN EATING OUT?  Ask for smaller portion sizes or child-sized portions.  Consider sharing an entree and sides instead of getting your own entree.  If you get your own entree, eat only half. Ask for a box at the beginning of your meal and put the rest of your entree in it so you are not tempted to eat it.  Look for the calories on the menu. If calories are listed,  choose the lower calorie options.  Choose dishes that include vegetables, fruits, whole grains, low-fat dairy products, and lean protein. Focusing on smart food choices from each of the 5 food groups can help you stay on track at restaurants.  Choose items that are boiled, broiled, grilled, or steamed.  Choose water, milk, unsweetened iced tea, or other drinks without added sugars. If you want an alcoholic beverage, choose a lower calorie option. For example, a regular margarita can have up to 700 calories and a glass of wine has around 150.  Stay away from items that are buttered, battered, fried, or served with cream sauce. Items labeled "crispy" are usually fried, unless stated otherwise.  Ask for dressings, sauces, and syrups on the side. These are usually very high in calories, so do not eat much of them.  Watch out for salads. Many people think salads are a healthy option, but this is often not the case. Many salads come with bacon, fried chicken, lots of cheese, fried chips, and dressing. All of these items have a lot of calories. If you want a salad, choose a garden salad and ask for grilled meats or steak. Ask for the dressing on the side, or ask for olive oil and vinegar or lemon to use as dressing.  Estimate how many servings of a food you are given. For example, a serving of cooked rice is  cup or about the size of half a tennis ball or one cupcake wrapper. Knowing serving sizes will help you be aware of how much food you are eating at restaurants. The list below tells you how big or small some common portion sizes are based on everyday objects.  1 oz--4 stacked dice.  3 oz--1 deck of cards.  1 tsp--1 dice.  1 Tbsp-- a Ping-Pong ball.  2 Tbsp--1 Ping-Pong ball.   cup--1 tennis ball   or 1 cupcake wrapper.  1 cup--1 baseball. Document Released: 08/29/2005 Document Revised: 01/13/2014 Document Reviewed: 07/04/2013 [facility] Patient Information 2015 [place]. This  information is not intended to replace advice given to you by your health care provider. Make sure you discuss any questions you have with your health care provider. Polycystic Ovarian Syndrome Polycystic ovarian syndrome (PCOS) is a common hormonal disorder among women of reproductive age. Most women with PCOS grow many small cysts on their ovaries. PCOS can cause problems with your periods and make it difficult to get pregnant. It can also cause an increased risk of miscarriage with pregnancy. If left untreated, PCOS can lead to serious health problems, such as diabetes and heart disease. CAUSES The cause of PCOS is not fully understood, but genetics may be a factor. SIGNS AND SYMPTOMS   Infrequent or no menstrual periods.   Inability to get pregnant (infertility) because of not ovulating.   Increased growth of hair on the face, chest, stomach, back, thumbs, thighs, or toes.   Acne, oily skin, or dandruff.   Pelvic pain.   Weight gain or obesity, usually carrying extra weight around the waist.   Type 2 diabetes.   High cholesterol.   High blood pressure.   Female-pattern baldness or thinning hair.   Patches of thickened and dark brown or black skin on the neck, arms, breasts, or thighs.   Tiny excess flaps of skin (skin tags) in the armpits or neck area.   Excessive snoring and having breathing stop at times while asleep (sleep apnea).   Deepening of the voice.   Gestational diabetes when pregnant.  DIAGNOSIS  There is no single test to diagnose PCOS.   Your health care provider will:   Take a medical history.   Perform a pelvic exam.   Have ultrasonography done.   Check your female and female hormone levels.   Measure glucose or sugar levels in the blood.   Do other blood tests.   If you are producing too many female hormones, your health care provider will make sure it is from PCOS. At the physical exam, your health care provider will want  to evaluate the areas of increased hair growth. Try to allow natural hair growth for a few days before the visit.   During a pelvic exam, the ovaries may be enlarged or swollen because of the increased number of small cysts. This can be seen more easily by using vaginal ultrasonography or screening to examine the ovaries and lining of the uterus (endometrium) for cysts. The uterine lining may become thicker if you have not been having a regular period.  TREATMENT  Because there is no cure for PCOS, it needs to be managed to prevent problems. Treatments are based on your symptoms. Treatment is also based on whether you want to have a baby or whether you need contraception.  Treatment may include:   Progesterone hormone to start a menstrual period.   Birth control pills to make you have regular menstrual periods.   Medicines to make you ovulate, if you want to get pregnant.   Medicines to control your insulin.   Medicine to control your blood pressure.   Medicine and diet to control your high cholesterol and triglycerides in your blood.  Medicine to reduce excessive hair growth.  Surgery, making small holes in the ovary, to decrease the amount of female hormone production. This is done through a long, lighted tube (laparoscope) placed into the pelvis through a tiny incision  in the lower abdomen.  HOME CARE INSTRUCTIONS  Only take over-the-counter or prescription medicine as directed by your health care provider.  Pay attention to the foods you eat and your activity levels. This can help reduce the effects of PCOS.  Keep your weight under control.  Eat foods that are low in carbohydrate and high in fiber.  Exercise regularly. SEEK MEDICAL CARE IF:  Your symptoms do not get better with medicine.  You have new symptoms. Document Released: 12/23/2004 Document Revised: 06/19/2013 Document Reviewed: 02/14/2013 [facility] Patient Information 2015 [place], [place]. This information  is not intended to replace advice given to you by your health care provider. Make sure you discuss any questions you have with your health care provider.

## 2014-12-10 NOTE — Progress Notes (Signed)
   Subjective:    Patient ID: Kathleen Blake, female    DOB: 1970-12-01, 44 y.o.   MRN: [id number]  HPI Pt presents to the office today to discuss PCOS and weight loss. Pt states she has been on metformin in 2007. She states she noticed improvement on her symptoms such as hair thinning on her head and hair growth on her body. Pt states she has also take phentermine in the past and had success with weight loss. Pt states she has tired dieting and exercising without success recently.    Review of Systems  Constitutional: Negative.   HENT: Negative.   Eyes: Negative.   Respiratory: Negative.  Negative for shortness of breath.   Cardiovascular: Negative.  Negative for palpitations.  Gastrointestinal: Negative.   Endocrine: Negative.   Genitourinary: Negative.   Musculoskeletal: Negative.   Neurological: Negative.  Negative for headaches.  Hematological: Negative.   Psychiatric/Behavioral: Negative.   All other systems reviewed and are negative.      Objective:   Physical Exam  Constitutional: She is oriented to person, place, and time. She appears well-developed and well-nourished. No distress.  HENT:  Head: Normocephalic and atraumatic.  Right Ear: External ear normal.  Left Ear: External ear normal.  Nose: Nose normal.  Mouth/Throat: Oropharynx is clear and moist.  Eyes: Pupils are equal, round, and reactive to light.  Neck: Normal range of motion. Neck supple. No thyromegaly present.  Cardiovascular: Normal rate, regular rhythm, normal heart sounds and intact distal pulses.   No murmur heard. Pulmonary/Chest: Effort normal and breath sounds normal. No respiratory distress. She has no wheezes.  Abdominal: Soft. Bowel sounds are normal. She exhibits no distension. There is no tenderness.  Musculoskeletal: Normal range of motion. She exhibits no edema or tenderness.  Neurological: She is alert and oriented to person, place, and time. She has normal reflexes. No cranial nerve  deficit.  Skin: Skin is warm and dry.  Psychiatric: She has a normal mood and affect. Her behavior is normal. Judgment and thought content normal.  Vitals reviewed.   BP 134/85 mmHg  Pulse 72  Temp(Src) 97.9 F (36.6 C) (Oral)  Ht 5' 7.5" (1.715 m)  Wt 225 lb (102.059 kg)  BMI 34.70 kg/m2       Assessment & Plan:  1. PCOD (polycystic ovarian disease) - metFORMIN (GLUCOPHAGE) 1000 MG tablet; Take 1 tablet (1,000 mg total) by mouth 2 (two) times daily with a meal.  Dispense: 180 tablet; Refill: 3 - CMP14+EGFR  2. Weight loss counseling, encounter for -Encouraged healthy diet and exercise -If pt has not lost >5% in 12 weeks will need to d/c meds - phentermine 37.5 MG capsule; Take 1 capsule (37.5 mg total) by mouth every morning.  Dispense: 90 capsule; Refill: 0 - CMP14+EGFR   Continue all meds Labs pending Health Maintenance reviewed Diet and exercise encouraged RTO 3 months  Evelina Dun, FNP

## 2014-12-11 LAB — CMP14+EGFR
ALT: 21 IU/L (ref 0–32)
AST: 17 IU/L (ref 0–40)
Albumin/Globulin Ratio: 1.7 (ref 1.1–2.5)
Albumin: 4 g/dL (ref 3.5–5.5)
Alkaline Phosphatase: 47 IU/L (ref 39–117)
BUN/Creatinine Ratio: 16 (ref 9–23)
BUN: 17 mg/dL (ref 6–24)
Bilirubin Total: <0.2 mg/dL (ref 0.0–1.2)
CALCIUM: 9.1 mg/dL (ref 8.7–10.2)
CO2: 24 mmol/L (ref 18–29)
CREATININE: 1.07 mg/dL — AB (ref 0.57–1.00)
Chloride: 102 mmol/L (ref 97–108)
GFR calc Af Amer: 73 mL/min/{1.73_m2} (ref >59)
GFR calc non Af Amer: 64 mL/min/{1.73_m2} (ref >59)
GLOBULIN, TOTAL: 2.3 g/dL (ref 1.5–4.5)
Glucose: 112 mg/dL — ABNORMAL HIGH (ref 65–99)
Potassium: 3.9 mmol/L (ref 3.5–5.2)
SODIUM: 140 mmol/L (ref 134–144)
Total Protein: 6.3 g/dL (ref 6.0–8.5)

## 2014-12-31 ENCOUNTER — Other Ambulatory Visit: Payer: Self-pay | Admitting: Family

## 2015-01-01 NOTE — Telephone Encounter (Signed)
[facility]14 [facility]

## 2015-01-29 ENCOUNTER — Other Ambulatory Visit: Payer: Self-pay | Admitting: Family

## 2015-02-14 ENCOUNTER — Other Ambulatory Visit: Payer: Self-pay | Admitting: Family Medicine

## 2015-02-18 ENCOUNTER — Other Ambulatory Visit: Payer: Self-pay | Admitting: Family

## 2015-02-18 NOTE — Telephone Encounter (Signed)
C-

## 2015-02-19 ENCOUNTER — Telehealth: Payer: Self-pay | Admitting: Family

## 2015-02-19 NOTE — Telephone Encounter (Signed)
Clonazepam or any other anxiety medication is not on pt's med's list.

## 2015-02-19 NOTE — Telephone Encounter (Signed)
Detailed message left on pt's voicemail that she will need to schedule appt for evaluation of anxiety

## 2015-02-24 ENCOUNTER — Ambulatory Visit: Payer: BC Managed Care – PPO | Admitting: Family

## 2015-02-25 ENCOUNTER — Encounter: Payer: Self-pay | Admitting: Family

## 2015-02-25 ENCOUNTER — Ambulatory Visit (INDEPENDENT_AMBULATORY_CARE_PROVIDER_SITE_OTHER): Payer: BC Managed Care – PPO | Admitting: Family

## 2015-02-25 VITALS — BP 138/96 | HR 83 | Temp 97.8°F | Ht 67.5 in | Wt 197.0 lb

## 2015-02-25 DIAGNOSIS — F411 Generalized anxiety disorder: Secondary | ICD-10-CM | POA: Diagnosis not present

## 2015-02-25 DIAGNOSIS — Z713 Dietary counseling and surveillance: Secondary | ICD-10-CM

## 2015-02-25 MED ORDER — PHENTERMINE HCL 37.5 MG PO CAPS: 37.5000 mg | ORAL_CAPSULE | ORAL | Status: DC

## 2015-02-25 MED ORDER — CLONAZEPAM 0.5 MG PO TBDP
0.5000 mg | ORAL_TABLET | Freq: Every day | ORAL | Status: DC | PRN
Start: 2015-02-25 — End: 2015-06-19

## 2015-02-25 NOTE — Patient Instructions (Signed)
Generalized Anxiety Disorder Generalized anxiety disorder (GAD) is a mental disorder. It interferes with life functions, including relationships, work, and school. GAD is different from normal anxiety, which everyone experiences at some point in their lives in response to specific life events and activities. Normal anxiety actually helps us prepare for and get through these life events and activities. Normal anxiety goes away after the event or activity is over.  GAD causes anxiety that is not necessarily related to specific events or activities. It also causes excess anxiety in proportion to specific events or activities. The anxiety associated with GAD is also difficult to control. GAD can vary from mild to severe. People with severe GAD can have intense waves of anxiety with physical symptoms (panic attacks).  SYMPTOMS The anxiety and worry associated with GAD are difficult to control. This anxiety and worry are related to many life events and activities and also occur more days than not for 6 months or longer. People with GAD also have three or more of the following symptoms (one or more in children):  Restlessness.   Fatigue.  Difficulty concentrating.   Irritability.  Muscle tension.  Difficulty sleeping or unsatisfying sleep. DIAGNOSIS GAD is diagnosed through an assessment by your health care provider. Your health care provider will ask you questions aboutyour mood,physical symptoms, and events in your life. Your health care provider may ask you about your medical history and use of alcohol or drugs, including prescription medicines. Your health care provider may also do a physical exam and blood tests. Certain medical conditions and the use of certain substances can cause symptoms similar to those associated with GAD. Your health care provider may refer you to a mental health specialist for further evaluation. TREATMENT The following therapies are usually used to treat GAD:    Medication. Antidepressant medication usually is prescribed for long-term daily control. Antianxiety medicines may be added in severe cases, especially when panic attacks occur.   Talk therapy (psychotherapy). Certain types of talk therapy can be helpful in treating GAD by providing support, education, and guidance. A form of talk therapy called cognitive behavioral therapy can teach you healthy ways to think about and react to daily life events and activities.  Stress managementtechniques. These include yoga, meditation, and exercise and can be very helpful when they are practiced regularly. A mental health specialist can help determine which treatment is best for you. Some people see improvement with one therapy. However, other people require a combination of therapies. Document Released: 12/24/2012 Document Revised: 01/13/2014 Document Reviewed: 12/24/2012 ExitCare Patient Information 2015 [employer]. This information is not intended to replace advice given to you by your health care provider. Make sure you discuss any questions you have with your health care provider.  

## 2015-02-25 NOTE — Progress Notes (Signed)
   Subjective:    Patient ID: Kathleen Blake, female    DOB: 03-May-1971, 44 y.o.   MRN: [id number]  Pt presents to the office today to discuss GAD. PT states she had a prescription for Klonopin as needed. States her last prescription has lasted her for the last 3 years. Pt also would like her phentermine refilled. Pt states she has lost 28 lb total. Pt states she is doing well with no complaints at this time.  Anxiety Presents for initial visit. Onset was 1 to 6 months ago. The problem has been waxing and waning. Symptoms include excessive worry, nervous/anxious behavior and palpitations. Patient reports no decreased concentration, depressed mood, muscle tension or shortness of breath. Symptoms occur rarely. The severity of symptoms is moderate. The symptoms are aggravated by family issues. The quality of sleep is fair.   Her past medical history is significant for anxiety/panic attacks. There is no history of depression. Past treatments include benzodiazephines. The treatment provided significant relief. Compliance with prior treatments has been good.      Review of Systems  Constitutional: Negative.   HENT: Negative.   Eyes: Negative.   Respiratory: Negative.  Negative for shortness of breath.   Cardiovascular: Positive for palpitations.  Gastrointestinal: Negative.   Endocrine: Negative.   Genitourinary: Negative.   Musculoskeletal: Negative.   Neurological: Negative.  Negative for headaches.  Hematological: Negative.   Psychiatric/Behavioral: Negative for decreased concentration. The patient is nervous/anxious.   All other systems reviewed and are negative.      Objective:   Physical Exam  Constitutional: She is oriented to person, place, and time. She appears well-developed and well-nourished. No distress.  HENT:  Head: Normocephalic and atraumatic.  Right Ear: External ear normal.  Left Ear: External ear normal.  Nose: Nose normal.  Mouth/Throat: Oropharynx is clear and  moist.  Eyes: Pupils are equal, round, and reactive to light.  Neck: Normal range of motion. Neck supple. No thyromegaly present.  Cardiovascular: Normal rate, regular rhythm, normal heart sounds and intact distal pulses.   No murmur heard. Pulmonary/Chest: Effort normal and breath sounds normal. No respiratory distress. She has no wheezes.  Abdominal: Soft. Bowel sounds are normal. She exhibits no distension. There is no tenderness.  Musculoskeletal: Normal range of motion. She exhibits no edema or tenderness.  Neurological: She is alert and oriented to person, place, and time. She has normal reflexes. No cranial nerve deficit.  Skin: Skin is warm and dry.  Psychiatric: She has a normal mood and affect. Her behavior is normal. Judgment and thought content normal.  Vitals reviewed.    BP 138/96 mmHg  Pulse 83  Temp(Src) 97.8 F (36.6 C) (Oral)  Ht 5' 7.5" (1.715 m)  Wt 197 lb (89.359 kg)  BMI 30.38 kg/m2      Assessment & Plan:  1. GAD (generalized anxiety disorder) -Stress management discussed -Only use Klonopin as needed - clonazePAM (KLONOPIN) 0.5 MG disintegrating tablet; Take 1 tablet (0.5 mg total) by mouth daily as needed for seizure.  Dispense: 30 tablet; Refill: 2  2. Weight loss counseling, encounter for -Encourage healthy diet and exercise -RTO in 3 months -BMP - phentermine 37.5 MG capsule; Take 1 capsule (37.5 mg total) by mouth every morning.  Dispense: 90 capsule; Refill: 0  Jannifer Rodney, FNP

## 2015-02-26 LAB — BMP8+EGFR
BUN/Creatinine Ratio: 11 (ref 9–23)
BUN: 12 mg/dL (ref 6–24)
CALCIUM: 9.9 mg/dL (ref 8.7–10.2)
CO2: 26 mmol/L (ref 18–29)
CREATININE: 1.05 mg/dL — AB (ref 0.57–1.00)
Chloride: 96 mmol/L — ABNORMAL LOW (ref 97–108)
GFR calc Af Amer: 75 mL/min/{1.73_m2} (ref >59)
GFR calc non Af Amer: 65 mL/min/{1.73_m2} (ref >59)
Glucose: 111 mg/dL — ABNORMAL HIGH (ref 65–99)
Potassium: 3.8 mmol/L (ref 3.5–5.2)
Sodium: 140 mmol/L (ref 134–144)

## 2015-04-03 ENCOUNTER — Other Ambulatory Visit: Payer: Self-pay | Admitting: Family

## 2015-04-13 ENCOUNTER — Other Ambulatory Visit: Payer: Self-pay | Admitting: Family

## 2015-05-12 ENCOUNTER — Other Ambulatory Visit: Payer: Self-pay | Admitting: Family

## 2015-05-24 ENCOUNTER — Other Ambulatory Visit: Payer: Self-pay | Admitting: Family

## 2015-06-10 ENCOUNTER — Other Ambulatory Visit: Payer: Self-pay | Admitting: Family

## 2015-06-15 ENCOUNTER — Other Ambulatory Visit: Payer: Self-pay | Admitting: Family

## 2015-06-19 ENCOUNTER — Ambulatory Visit (INDEPENDENT_AMBULATORY_CARE_PROVIDER_SITE_OTHER): Payer: BC Managed Care – PPO

## 2015-06-19 ENCOUNTER — Other Ambulatory Visit: Payer: Self-pay | Admitting: Family Medicine

## 2015-06-19 ENCOUNTER — Encounter: Payer: Self-pay | Admitting: Family Medicine

## 2015-06-19 ENCOUNTER — Ambulatory Visit (INDEPENDENT_AMBULATORY_CARE_PROVIDER_SITE_OTHER): Payer: BC Managed Care – PPO | Admitting: Family Medicine

## 2015-06-19 VITALS — BP 148/107 | HR 89 | Temp 98.1°F | Ht 67.5 in | Wt 203.0 lb

## 2015-06-19 DIAGNOSIS — Z713 Dietary counseling and surveillance: Secondary | ICD-10-CM

## 2015-06-19 DIAGNOSIS — S92401A Displaced unspecified fracture of right great toe, initial encounter for closed fracture: Secondary | ICD-10-CM

## 2015-06-19 DIAGNOSIS — R52 Pain, unspecified: Secondary | ICD-10-CM

## 2015-06-19 DIAGNOSIS — S92403A Displaced unspecified fracture of unspecified great toe, initial encounter for closed fracture: Secondary | ICD-10-CM | POA: Insufficient documentation

## 2015-06-19 DIAGNOSIS — F411 Generalized anxiety disorder: Secondary | ICD-10-CM | POA: Diagnosis not present

## 2015-06-19 MED ORDER — TRAMADOL HCL 50 MG PO TABS: 50.0000 mg | ORAL_TABLET | Freq: Three times a day (TID) | ORAL | Status: DC | PRN

## 2015-06-19 MED ORDER — CLONAZEPAM 0.5 MG PO TBDP: 0.5000 mg | ORAL_TABLET | Freq: Every day | ORAL | Status: DC | PRN

## 2015-06-19 MED ORDER — PHENTERMINE HCL 37.5 MG PO CAPS: 37.5000 mg | ORAL_CAPSULE | ORAL | Status: DC

## 2015-06-19 NOTE — Patient Instructions (Addendum)
Great to meet you!  Wear the wooden shoe and keep your feet elevated as much as you can Use crutches if you have pain with walking.   Call us tuesday if you haven't heard about an appt.

## 2015-06-19 NOTE — Progress Notes (Signed)
   HPI  Patient presents today here for concern for broken right toe.  Patient explains that today she was walking in some way chills and stepped her toe when she practically stood up on the tip of her toe. She's had swelling, bruising, and pain at that toe since that time. She tolerating walking in her shoe pretty easily  She needs a refill on her Klonopin and phentermine I recommended that she consider an SSRI as an addition to her Klonopin  PMH: Smoking status noted ROS: Per HPI  Objective: BP 148/107 mmHg  Pulse 89  Temp(Src) 98.1 F (36.7 C) (Oral)  Ht 5' 7.5" (1.715 m)  Wt 203 lb (92.08 kg)  BMI 31.31 kg/m2 Gen: NAD, alert, cooperative with exam HEENT: NCAT, EOMI, PERRL CV: RRR, good S1/S2, no murmur Resp: CTABL, no wheezes, non-labored Abd: SNTND, BS present, no guarding or organomegaly Ext: No edema, warm Neuro: Alert and oriented, No gross deficits  Assessment and plan:  # Distal right phalanx great toe fracture Patient is placed in a wooden shoe which she is tolerating walking around and easily in the clinic Its nondisplaced Tramadol for pain Referral to orthopedic surgery for additional recommendations early next week Recommend crutches if she has pain with walking   Orders Placed This Encounter  Procedures  . Ambulatory referral to Orthopedic Surgery    Referral Priority:  Urgent    Referral Type:  Surgical    Referral Reason:  Specialty Services Required    Requested Specialty:  Orthopedic Surgery    Number of Visits Requested:  1    Meds ordered this encounter  Medications  . phentermine 37.5 MG capsule    Sig: Take 1 capsule (37.5 mg total) by mouth every morning.    Dispense:  30 capsule    Refill:  0  . clonazePAM (KLONOPIN) 0.5 MG disintegrating tablet    Sig: Take 1 tablet (0.5 mg total) by mouth daily as needed for seizure.    Dispense:  30 tablet    Refill:  0  . traMADol (ULTRAM) 50 MG tablet    Sig: Take 1 tablet (50 mg total) by  mouth every 8 (eight) hours as needed.    Dispense:  30 tablet    Refill:  0    [name], MD [name] 06/19/2015, 6:07 PM

## 2015-06-23 ENCOUNTER — Telehealth: Payer: Self-pay | Admitting: Family Medicine

## 2015-07-02 ENCOUNTER — Other Ambulatory Visit: Payer: Self-pay | Admitting: Family

## 2015-07-06 ENCOUNTER — Ambulatory Visit (INDEPENDENT_AMBULATORY_CARE_PROVIDER_SITE_OTHER): Payer: BC Managed Care – PPO | Admitting: Family Medicine

## 2015-07-06 ENCOUNTER — Encounter: Payer: Self-pay | Admitting: Family Medicine

## 2015-07-06 VITALS — BP 114/87 | HR 82 | Temp 98.3°F | Ht 67.5 in | Wt 203.8 lb

## 2015-07-06 DIAGNOSIS — S92401A Displaced unspecified fracture of right great toe, initial encounter for closed fracture: Secondary | ICD-10-CM

## 2015-07-06 DIAGNOSIS — J189 Pneumonia, unspecified organism: Secondary | ICD-10-CM | POA: Insufficient documentation

## 2015-07-06 MED ORDER — LEVOFLOXACIN 500 MG PO TABS: 500.0000 mg | ORAL_TABLET | Freq: Every day | ORAL | Status: DC

## 2015-07-06 MED ORDER — BENZONATATE 100 MG PO CAPS: 100.0000 mg | ORAL_CAPSULE | Freq: Two times a day (BID) | ORAL | Status: DC | PRN

## 2015-07-06 NOTE — Patient Instructions (Signed)
Great to see you again!  I am treating you for pneumonia with a strong antibiotic, levaquin.   Community-Acquired Pneumonia, Adult Pneumonia is an infection of the lungs. There are different types of pneumonia. One type can develop while a person is in a hospital. A different type, called community-acquired pneumonia, develops in people who are not, or have not recently been, in the hospital or other health care facility.  CAUSES Pneumonia may be caused by bacteria, viruses, or funguses. Community-acquired pneumonia is often caused by Streptococcus pneumonia bacteria. These bacteria are often passed from one person to another by breathing in droplets from the cough or sneeze of an infected person. RISK FACTORS The condition is more likely to develop in:  People who havechronic diseases, such as chronic obstructive pulmonary disease (COPD), asthma, congestive heart failure, cystic fibrosis, diabetes, or kidney disease.  People who haveearly-stage or late-stage HIV.  People who havesickle cell disease.  People who havehad their spleen removed (splenectomy).  People who havepoor Administratordental hygiene.  People who havemedical conditions that increase the risk of breathing in (aspirating) secretions their own mouth and nose.   People who havea weakened immune system (immunocompromised).  People who smoke.  People whotravel to areas where pneumonia-causing germs commonly exist.  People whoare around animal habitats or animals that have pneumonia-causing germs, including birds, bats, rabbits, cats, and farm animals. SYMPTOMS Symptoms of this condition include:  Adry cough.  A wet (productive) cough.  Fever.  Sweating.  Chest pain, especially when breathing deeply or coughing.  Rapid breathing or difficulty breathing.  Shortness of breath.  Shaking chills.  Fatigue.  Muscle aches. DIAGNOSIS Your health care provider will take a medical history and perform a physical  exam. You may also have other tests, including:  Imaging studies of your chest, including X-rays.  Tests to check your blood oxygen level and other blood gases.  Other tests on blood, mucus (sputum), fluid around your lungs (pleural fluid), and urine. If your pneumonia is severe, other tests may be done to identify the specific cause of your illness. TREATMENT The type of treatment that you receive depends on many factors, such as the cause of your pneumonia, the medicines you take, and other medical conditions that you have. For most adults, treatment and recovery from pneumonia may occur at home. In some cases, treatment must happen in a hospital. Treatment may include:  Antibiotic medicines, if the pneumonia was caused by bacteria.  Antiviral medicines, if the pneumonia was caused by a virus.  Medicines that are given by mouth or through an IV tube.  Oxygen.  Respiratory therapy. Although rare, treating severe pneumonia may include:  Mechanical ventilation. This is done if you are not breathing well on your own and you cannot maintain a safe blood oxygen level.  Thoracentesis. This procedureremoves fluid around one lung or both lungs to help you breathe better. HOME CARE INSTRUCTIONS  Take over-the-counter and prescription medicines only as told by your health care provider.  Only takecough medicine if you are losing sleep. Understand that cough medicine can prevent your body's natural ability to remove mucus from your lungs.  If you were prescribed an antibiotic medicine, take it as told by your health care provider. Do not stop taking the antibiotic even if you start to feel better.  Sleep in a semi-upright position at night. Try sleeping in a reclining chair, or place a few pillows under your head.  Do not use tobacco products, including cigarettes, chewing tobacco, and  e-cigarettes. If you need help quitting, ask your health care provider.  Drink enough water to keep  your urine clear or pale yellow. This will help to thin out mucus secretions in your lungs. PREVENTION There are ways that you can decrease your risk of developing community-acquired pneumonia. Consider getting a pneumococcal vaccine if:  You are older than 44 years of age.  You are older than 43 years of age and are undergoing cancer treatment, have chronic lung disease, or have other medical conditions that affect your immune system. Ask your health care provider if this applies to you. There are different types and schedules of pneumococcal vaccines. Ask your health care provider which vaccination option is best for you. You may also prevent community-acquired pneumonia if you take these actions:  Get an influenza vaccine every year. Ask your health care provider which type of influenza vaccine is best for you.  Go to the dentist on a regular basis.  Wash your hands often. Use hand sanitizer if soap and water are not available. SEEK MEDICAL CARE IF:  You have a fever.  You are losing sleep because you cannot control your cough with cough medicine. SEEK IMMEDIATE MEDICAL CARE IF:  You have worsening shortness of breath.  You have increased chest pain.  Your sickness becomes worse, especially if you are an older adult or have a weakened immune system.  You cough up blood.   This information is not intended to replace advice given to you by your health care provider. Make sure you discuss any questions you have with your health care provider.   Document Released: 08/29/2005 Document Revised: 05/20/2015 Document Reviewed: 12/24/2014 Elsevier Interactive Patient Education [employer].

## 2015-07-06 NOTE — Progress Notes (Signed)
   HPI  Patient presents today for evaluation of cough.  Patient explains that she's been sick for 15 days with cough, burning chest pain, and nasal congestion, and chest congestion. She also complains of dyspnea and wheezing. She's had some nausea over the weekend but is still tolerating food and fluids easily.  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 but has not had any sick contacts  PMH: Smoking status noted ROS: Per HPI  Objective: BP 114/87 mmHg  Pulse 82  Temp(Src) 98.3 F (36.8 C) (Oral)  Ht 5' 7.5" (1.715 m)  Wt 203 lb 12.8 oz (92.443 kg)  BMI 31.43 kg/m2 Gen: NAD, alert, cooperative with exam HEENT: NCAT CV: RRR, good S1/S2, no murmur Resp: Nonlabored, expiratory coarse sounds throughout Ext: No swelling, tenderness, redness, or malformation of her great toe Neuro: Alert and oriented, No gross deficits  Assessment and plan:  # Community acquired pneumonia Treat with Levaquin Treating clinically without a chest x-ray given her duration of illness and lung exam. Reasons to return provided  2 days out of work for [employer] given as well  # Great toe fracture She states that she never returned the call to orthopedics due to her pain completely resolving before they got to her. I advised that she follow-up with them if she has any additional pain   Meds ordered this encounter  Medications  . levofloxacin (LEVAQUIN) 500 MG tablet    Sig: Take 1 tablet (500 mg total) by mouth daily.    Dispense:  10 tablet    Refill:  0  . benzonatate (TESSALON) 100 MG capsule    Sig: Take 1 capsule (100 mg total) by mouth 2 (two) times daily as needed for cough.    Dispense:  20 capsule    Refill:  0    Murtis SinkSam Bradshaw, MD [facility] 07/06/2015, 5:01 PM

## 2015-07-07 ENCOUNTER — Telehealth: Payer: Self-pay | Admitting: Family Medicine

## 2015-07-07 NOTE — Telephone Encounter (Signed)
That sounds fine, will sk nursing to write the note.   Murtis SinkSam Shanoah Asbill, MD [facility] 07/07/2015, 5:19 PM

## 2015-07-08 NOTE — Telephone Encounter (Signed)
Pt aware and letter ready for pick up

## 2015-07-15 ENCOUNTER — Other Ambulatory Visit: Payer: Self-pay | Admitting: Family

## 2015-07-16 ENCOUNTER — Other Ambulatory Visit: Payer: Self-pay

## 2015-07-16 MED ORDER — BENZONATATE 100 MG PO CAPS: 100.0000 mg | ORAL_CAPSULE | Freq: Two times a day (BID) | ORAL | Status: DC | PRN

## 2015-07-16 NOTE — Telephone Encounter (Signed)
Last seen 07/06/15 Kathleen Blake last filled 07/06/15 #30

## 2015-07-18 ENCOUNTER — Encounter: Payer: Self-pay | Admitting: Family Medicine

## 2015-07-18 ENCOUNTER — Ambulatory Visit (INDEPENDENT_AMBULATORY_CARE_PROVIDER_SITE_OTHER): Payer: BC Managed Care – PPO | Admitting: Family Medicine

## 2015-07-18 VITALS — BP 114/83 | HR 65 | Temp 97.2°F | Ht 67.5 in | Wt 203.8 lb

## 2015-07-18 DIAGNOSIS — J40 Bronchitis, not specified as acute or chronic: Secondary | ICD-10-CM

## 2015-07-18 DIAGNOSIS — J301 Allergic rhinitis due to pollen: Secondary | ICD-10-CM | POA: Diagnosis not present

## 2015-07-18 DIAGNOSIS — J209 Acute bronchitis, unspecified: Secondary | ICD-10-CM

## 2015-07-18 MED ORDER — PREDNISONE 10 MG PO TABS: ORAL_TABLET | ORAL | Status: DC

## 2015-07-18 MED ORDER — METHYLPREDNISOLONE ACETATE 80 MG/ML IJ SUSP
60.0000 mg | Freq: Once | INTRAMUSCULAR | Status: AC
  Administered 2015-07-18: 60 mg via INTRAMUSCULAR

## 2015-07-18 MED ORDER — FLUTICASONE PROPIONATE 50 MCG/ACT NA SUSP: 2.0000 | Freq: Every day | NASAL | Status: DC

## 2015-07-18 NOTE — Addendum Note (Signed)
Addended by: Tamera PuntWRAY, Azyria Osmon S on: 07/18/2015 10:20 AM   Modules accepted: Orders

## 2015-07-18 NOTE — Progress Notes (Signed)
Subjective:    Patient ID: Kathleen Blake, female    DOB: September 11, 1971, 44 y.o.   MRN: [id number]  HPI Patient is here today with cough, chest congestion, fatigue, and sob since 06/16/2015. Patient was seen on 07/06/2015 and given levaquin. Patient has not had much improvement. She has finished the Levaquin. She keeps on with a persistent cough and she has been fatigued from all of this.  Review of Systems  Constitutional: Positive for fatigue.  HENT: Positive for congestion.   Eyes: Negative.   Respiratory: Positive for cough and shortness of breath.   Cardiovascular: Negative.   Gastrointestinal: Negative.   Endocrine: Negative.   Genitourinary: Negative.   Musculoskeletal: Negative.   Skin: Negative.   Allergic/Immunologic: Negative.   Neurological: Negative.   Hematological: Negative.   Psychiatric/Behavioral: Negative.        Patient Active Problem List   Diagnosis Date Noted  . CAP (community acquired pneumonia) 07/06/2015  . Fracture of great toe 06/19/2015  . GAD (generalized anxiety disorder) 02/25/2015  . PCOD (polycystic ovarian disease) 12/10/2014  . GERD (gastroesophageal reflux disease) 06/09/2014  . Essential hypertension, benign 04/09/2013  . Unspecified epilepsy without mention of intractable epilepsy 04/09/2013  . Abdominal pain, chronic, right upper quadrant 04/09/2013   Outpatient Encounter Prescriptions as of 07/18/2015  Medication Sig  . albuterol (PROVENTIL HFA;VENTOLIN HFA) 108 (90 BASE) MCG/ACT inhaler Inhale 2 puffs into the lungs every 6 (six) hours as needed for wheezing or shortness of breath.  . benzonatate (TESSALON) 100 MG capsule Take 1 capsule (100 mg total) by mouth 2 (two) times daily as needed for cough.  . carbamazepine (TEGRETOL) 200 MG tablet TAKE 1 TABLET (200 MG TOTAL) BY MOUTH AT BEDTIME.  . clonazePAM (KLONOPIN) 0.5 MG disintegrating tablet Take 1 tablet (0.5 mg total) by mouth daily as needed for seizure.  . labetalol  (NORMODYNE) 200 MG tablet TAKE 1 TABLET (200 MG TOTAL) BY MOUTH DAILY.  . metFORMIN (GLUCOPHAGE) 1000 MG tablet Take 1 tablet (1,000 mg total) by mouth 2 (two) times daily with a meal.  . pantoprazole (PROTONIX) 40 MG tablet TAKE 1 TABLET (40 MG TOTAL) BY MOUTH DAILY AS NEEDED. FOR HEARTBURN  . hydrochlorothiazide (MICROZIDE) 12.5 MG capsule TAKE 1 TABLET (12.5 MG TOTAL) BY MOUTH DAILY.  Marland Kitchen. phentermine 37.5 MG capsule Take 1 capsule (37.5 mg total) by mouth every morning. (Patient not taking: Reported on 07/18/2015)  . [DISCONTINUED] levofloxacin (LEVAQUIN) 500 MG tablet Take 1 tablet (500 mg total) by mouth daily. (Patient not taking: Reported on 07/18/2015)  . [DISCONTINUED] traMADol (ULTRAM) 50 MG tablet Take 1 tablet (50 mg total) by mouth every 8 (eight) hours as needed. (Patient not taking: Reported on 07/18/2015)   No facility-administered encounter medications on file as of 07/18/2015.       Objective:   Physical Exam  Constitutional: She is oriented to person, place, and time. She appears well-developed and well-nourished. No distress.  HENT:  Head: Normocephalic and atraumatic.  Right Ear: External ear normal.  Left Ear: External ear normal.  Mouth/Throat: Oropharynx is clear and moist.  Nasal turbinate congestion bilaterally  Eyes: Conjunctivae and EOM are normal. Pupils are equal, round, and reactive to light. Right eye exhibits no discharge. Left eye exhibits no discharge. No scleral icterus.  Neck: Normal range of motion. Neck supple. No thyromegaly present.  Anterior cervical tenderness without adenopathy  Cardiovascular: Normal rate, regular rhythm and normal heart sounds.   No murmur heard. Pulmonary/Chest: Effort normal and breath  sounds normal. No respiratory distress. She has no wheezes. She has no rales. She exhibits no tenderness.  Dry cough without rales or rhonchi  Musculoskeletal: Normal range of motion. She exhibits no edema.  Lymphadenopathy:    She has no cervical  adenopathy.  Neurological: She is alert and oriented to person, place, and time.  Skin: Skin is warm and dry. No rash noted.  Psychiatric: She has a normal mood and affect. Her behavior is normal. Judgment and thought content normal.  Nursing note and vitals reviewed.  BP 114/83 mmHg  Pulse 65  Temp(Src) 97.2 F (36.2 C) (Oral)  Ht 5' 7.5" (1.715 m)  Wt 203 lb 12.8 oz (92.443 kg)  BMI 31.43 kg/m2        Assessment & Plan:  1. Bronchitis with bronchospasm -Drink plenty of fluids and take Mucinex maximum strength blue and white in color twice daily with a large glass of water - methylPREDNISolone acetate (DEPO-MEDROL) injection 60 mg; Inject 0.75 mLs (60 mg total) into the muscle once. - predniSONE (DELTASONE) 10 MG tablet; 1 tablet 4 times a day for 2 days,  1 tablet 3 times a day for 2 days,  1 tablet 2 times a day for 2 days, 1 tablet daily for 2 days  Dispense: 20 tablet; Refill: 0  2. Allergic rhinitis due to pollen -Use nasal saline during the day - fluticasone (FLONASE) 50 MCG/ACT nasal spray; Place 2 sprays into both nostrils daily.  Dispense: 16 g; Refill: 6  Patient Instructions  Continue to drink plenty of fluids Take Tylenol for aches pains and fever Take Mucinex blue and white in color, 1 twice daily with a large glass of water for cough and congestion Use nasal saline frequently through the day in each nostril Use Flonase 1-2 sprays at nighttime Take prednisone as directed   Nyra Capes MD

## 2015-07-18 NOTE — Patient Instructions (Signed)
Continue to drink plenty of fluids Take Tylenol for aches pains and fever Take Mucinex blue and white in color, 1 twice daily with a large glass of water for cough and congestion Use nasal saline frequently through the day in each nostril Use Flonase 1-2 sprays at nighttime Take prednisone as directed

## 2015-07-18 NOTE — Addendum Note (Signed)
Addended by: Orma RenderHODGES, Sonnia Strong F on: 07/18/2015 10:33 AM   Modules accepted: Orders

## 2015-07-19 LAB — CBC WITH DIFFERENTIAL/PLATELET
BASOS: 0 %
Basophils Absolute: 0 10*3/uL (ref 0.0–0.2)
EOS (ABSOLUTE): 0.1 10*3/uL (ref 0.0–0.4)
Eos: 2 %
Hematocrit: 36.9 % (ref 34.0–46.6)
Hemoglobin: 12.2 g/dL (ref 11.1–15.9)
Immature Grans (Abs): 0 10*3/uL (ref 0.0–0.1)
Immature Granulocytes: 0 %
Lymphocytes Absolute: 2.8 10*3/uL (ref 0.7–3.1)
Lymphs: 47 %
MCH: 29.3 pg (ref 26.6–33.0)
MCHC: 33.1 g/dL (ref 31.5–35.7)
MCV: 89 fL (ref 79–97)
MONOS ABS: 0.4 10*3/uL (ref 0.1–0.9)
Monocytes: 7 %
NEUTROS ABS: 2.5 10*3/uL (ref 1.4–7.0)
NEUTROS PCT: 44 %
PLATELETS: 275 10*3/uL (ref 150–379)
RBC: 4.16 x10E6/uL (ref 3.77–5.28)
RDW: 14.2 % (ref 12.3–15.4)
WBC: 5.8 10*3/uL (ref 3.4–10.8)

## 2015-07-19 LAB — BMP8+EGFR
BUN / CREAT RATIO: 14 (ref 9–23)
BUN: 13 mg/dL (ref 6–24)
CHLORIDE: 99 mmol/L (ref 97–106)
CO2: 25 mmol/L (ref 18–29)
Calcium: 9.1 mg/dL (ref 8.7–10.2)
Creatinine, Ser: 0.95 mg/dL (ref 0.57–1.00)
GFR calc non Af Amer: 73 mL/min/{1.73_m2} (ref >59)
GFR, EST AFRICAN AMERICAN: 84 mL/min/{1.73_m2} (ref >59)
Glucose: 106 mg/dL — ABNORMAL HIGH (ref 65–99)
Potassium: 3.8 mmol/L (ref 3.5–5.2)
Sodium: 139 mmol/L (ref 136–144)

## 2015-07-20 ENCOUNTER — Other Ambulatory Visit: Payer: BC Managed Care – PPO

## 2015-07-20 NOTE — Progress Notes (Signed)
Patient informed via detailed message 

## 2015-07-21 ENCOUNTER — Ambulatory Visit (INDEPENDENT_AMBULATORY_CARE_PROVIDER_SITE_OTHER): Payer: BC Managed Care – PPO

## 2015-07-21 ENCOUNTER — Ambulatory Visit (INDEPENDENT_AMBULATORY_CARE_PROVIDER_SITE_OTHER): Payer: BC Managed Care – PPO | Admitting: Family Medicine

## 2015-07-21 ENCOUNTER — Encounter (INDEPENDENT_AMBULATORY_CARE_PROVIDER_SITE_OTHER): Payer: Self-pay

## 2015-07-21 ENCOUNTER — Telehealth: Payer: Self-pay

## 2015-07-21 VITALS — BP 115/75 | HR 73 | Temp 97.8°F | Ht 67.5 in | Wt 203.0 lb

## 2015-07-21 DIAGNOSIS — R0600 Dyspnea, unspecified: Secondary | ICD-10-CM

## 2015-07-21 DIAGNOSIS — J200 Acute bronchitis due to Mycoplasma pneumoniae: Secondary | ICD-10-CM | POA: Diagnosis not present

## 2015-07-21 DIAGNOSIS — J40 Bronchitis, not specified as acute or chronic: Secondary | ICD-10-CM

## 2015-07-21 DIAGNOSIS — J209 Acute bronchitis, unspecified: Secondary | ICD-10-CM

## 2015-07-21 DIAGNOSIS — R0689 Other abnormalities of breathing: Secondary | ICD-10-CM

## 2015-07-21 MED ORDER — MOXIFLOXACIN HCL 400 MG PO TABS: 400.0000 mg | ORAL_TABLET | Freq: Every day | ORAL | Status: DC

## 2015-07-21 MED ORDER — HYDROCODONE-HOMATROPINE 5-1.5 MG/5ML PO SYRP: 5.0000 mL | ORAL_SOLUTION | Freq: Four times a day (QID) | ORAL | Status: DC | PRN

## 2015-07-21 MED ORDER — BETAMETHASONE SOD PHOS & ACET 6 (3-3) MG/ML IJ SUSP
6.0000 mg | Freq: Once | INTRAMUSCULAR | Status: AC
  Administered 2015-07-21: 6 mg via INTRAMUSCULAR

## 2015-07-21 NOTE — Telephone Encounter (Signed)
Trouble breathing- NTBS- pt aware on VM

## 2015-07-21 NOTE — Telephone Encounter (Signed)
Pt wants to know if she still needs to be seen tonight or can we just prescribe her an inhaler as we have done in the past (per pt) CXR shows bronchitis, no pneumonia

## 2015-07-21 NOTE — Progress Notes (Signed)
Subjective:  Patient ID: Kathleen Blake, female    DOB: 25-Dec-1970  Age: 44 y.o. MRN: [id number]  CC: URI   HPI Kathleen Blake presents with upper respiratory congestion. Rhinorrhea that is frequently purulent. There is moderate sore throat. Patient reports coughing frequently nonproductive. There is no fever no chills no sweats. The patient has anterior chest tightness and feels moderately short of breath. She is able to go about normal activities. Onset was about a month ago. Gradually worsening in spite of treatment. 2 notes from Dr. Ermalinda Memos were reviewed. Apparently she was coughed on when she was here for a broken toe last month and that's when the symptoms started. Of more interest is his second note dated October 24. At that time she had symptoms very similar to what she reports today. She was given a 10 day course of levofloxacin and Tessalon. Unfortunately those did not resolve her symptoms.  History Kathleen Blake has a past medical history of Anxiety; Polycystic disease, ovaries; Seizures (HCC); Hypertension; and Epilepsy (HCC).   She has past surgical history that includes Cesarean section; Cesarean section (08/15/2012); and Tubal ligation (08/15/2012).   Her family history includes Bipolar disorder in her mother; Birth defects in her maternal aunt; Cancer in her mother; Depression in her father; Diabetes in her father and paternal grandmother; Heart disease in her maternal grandfather, maternal grandmother, and mother; Pancreatitis in her father; Stroke in her father.She reports that she has never smoked. She does not have any smokeless tobacco history on file. She reports that she drinks alcohol. She reports that she does not use illicit drugs.  Outpatient Prescriptions Prior to Visit  Medication Sig Dispense Refill  . benzonatate (TESSALON) 100 MG capsule Take 1 capsule (100 mg total) by mouth 2 (two) times daily as needed for cough. 20 capsule 0  . carbamazepine (TEGRETOL) 200 MG  tablet TAKE 1 TABLET (200 MG TOTAL) BY MOUTH AT BEDTIME. 90 tablet 0  . clonazePAM (KLONOPIN) 0.5 MG disintegrating tablet Take 1 tablet (0.5 mg total) by mouth daily as needed for seizure. 30 tablet 0  . fluticasone (FLONASE) 50 MCG/ACT nasal spray Place 2 sprays into both nostrils daily. 16 g 6  . hydrochlorothiazide (MICROZIDE) 12.5 MG capsule TAKE 1 TABLET (12.5 MG TOTAL) BY MOUTH DAILY. 90 capsule 1  . labetalol (NORMODYNE) 200 MG tablet TAKE 1 TABLET (200 MG TOTAL) BY MOUTH DAILY. 90 tablet 0  . metFORMIN (GLUCOPHAGE) 1000 MG tablet Take 1 tablet (1,000 mg total) by mouth 2 (two) times daily with a meal. 180 tablet 3  . pantoprazole (PROTONIX) 40 MG tablet TAKE 1 TABLET (40 MG TOTAL) BY MOUTH DAILY AS NEEDED. FOR HEARTBURN 90 tablet 0  . phentermine 37.5 MG capsule Take 1 capsule (37.5 mg total) by mouth every morning. 30 capsule 0  . predniSONE (DELTASONE) 10 MG tablet 1 tablet 4 times a day for 2 days,  1 tablet 3 times a day for 2 days,  1 tablet 2 times a day for 2 days, 1 tablet daily for 2 days 20 tablet 0  . albuterol (PROVENTIL HFA;VENTOLIN HFA) 108 (90 BASE) MCG/ACT inhaler Inhale 2 puffs into the lungs every 6 (six) hours as needed for wheezing or shortness of breath. 1 Inhaler 0   No facility-administered medications prior to visit.    ROS Review of Systems  Constitutional: Negative for fever, chills, activity change and appetite change.  HENT: Positive for congestion, postnasal drip, rhinorrhea and sinus pressure. Negative for ear discharge, ear  pain, hearing loss, nosebleeds, sneezing and trouble swallowing.   Respiratory: Negative for chest tightness and shortness of breath.   Cardiovascular: Negative for chest pain and palpitations.  Skin: Negative for rash.    Objective:  BP 115/75 mmHg  Pulse 73  Temp(Src) 97.8 F (36.6 C) (Oral)  Ht 5' 7.5" (1.715 m)  Wt 203 lb (92.08 kg)  BMI 31.31 kg/m2  SpO2 98%  BP Readings from Last 3 Encounters:  07/21/15 115/75    07/18/15 114/83  07/06/15 114/87    Wt Readings from Last 3 Encounters:  07/21/15 203 lb (92.08 kg)  07/18/15 203 lb 12.8 oz (92.443 kg)  07/06/15 203 lb 12.8 oz (92.443 kg)     Physical Exam  Constitutional: She appears well-developed and well-nourished.  HENT:  Head: Normocephalic and atraumatic.  Right Ear: Tympanic membrane and external ear normal. No decreased hearing is noted.  Left Ear: Tympanic membrane and external ear normal. No decreased hearing is noted.  Nose: Mucosal edema present. Right sinus exhibits no frontal sinus tenderness. Left sinus exhibits no frontal sinus tenderness.  Mouth/Throat: No oropharyngeal exudate or posterior oropharyngeal erythema.  Neck: No Brudzinski's sign noted.  Pulmonary/Chest: Breath sounds normal. No respiratory distress.  Lymphadenopathy:       Head (right side): No preauricular adenopathy present.       Head (left side): No preauricular adenopathy present.       Right cervical: No superficial cervical adenopathy present.      Left cervical: No superficial cervical adenopathy present.    No results found for: HGBA1C  Lab Results  Component Value Date   WBC 5.8 07/18/2015   HGB 13.7 02/26/2013   HCT 36.9 07/18/2015   PLT 171 08/16/2012   GLUCOSE 106* 07/18/2015   ALT 21 12/10/2014   AST 17 12/10/2014   NA 139 07/18/2015   K 3.8 07/18/2015   CL 99 07/18/2015   CREATININE 0.95 07/18/2015   BUN 13 07/18/2015   CO2 25 07/18/2015    No results found.  Assessment & Plan:   Kathleen Blake was seen today for uri.  Diagnoses and all orders for this visit:  Dyspnea and respiratory abnormality -     D-dimer, quantitative (not at [facility]) -     betamethasone acetate-betamethasone sodium phosphate (CELESTONE) injection 6 mg; Inject 1 mL (6 mg total) into the muscle once.  Acute bronchitis due to Mycoplasma pneumoniae -     betamethasone acetate-betamethasone sodium phosphate (CELESTONE) injection 6 mg; Inject 1 mL (6 mg total) into  the muscle once.  Other orders -     moxifloxacin (AVELOX) 400 MG tablet; Take 1 tablet (400 mg total) by mouth daily at 8 pm. -     HYDROcodone-homatropine (HYCODAN) 5-1.5 MG/5ML syrup; Take 5 mLs by mouth every 6 (six) hours as needed for cough.   I am having Kathleen Blake start on moxifloxacin and HYDROcodone-homatropine. I am also having her maintain her labetalol, metFORMIN, pantoprazole, phentermine, clonazePAM, carbamazepine, hydrochlorothiazide, benzonatate, predniSONE, and fluticasone. We administered betamethasone acetate-betamethasone sodium phosphate.  Meds ordered this encounter  Medications  . moxifloxacin (AVELOX) 400 MG tablet    Sig: Take 1 tablet (400 mg total) by mouth daily at 8 pm.    Dispense:  10 tablet    Refill:  0  . HYDROcodone-homatropine (HYCODAN) 5-1.5 MG/5ML syrup    Sig: Take 5 mLs by mouth every 6 (six) hours as needed for cough.    Dispense:  120 mL    Refill:  0  . betamethasone acetate-betamethasone sodium phosphate (CELESTONE) injection 6 mg    Sig:      Follow-up: Return if symptoms worsen or fail to improve.  Mechele ClaudeWarren Astrid Vides, M.D.

## 2015-07-22 ENCOUNTER — Other Ambulatory Visit: Payer: Self-pay | Admitting: Family Medicine

## 2015-07-22 DIAGNOSIS — R059 Cough, unspecified: Secondary | ICD-10-CM

## 2015-07-22 DIAGNOSIS — J209 Acute bronchitis, unspecified: Secondary | ICD-10-CM

## 2015-07-22 DIAGNOSIS — R05 Cough: Secondary | ICD-10-CM

## 2015-07-22 MED ORDER — ALBUTEROL SULFATE HFA 108 (90 BASE) MCG/ACT IN AERS: 2.0000 | INHALATION_SPRAY | Freq: Four times a day (QID) | RESPIRATORY_TRACT | Status: DC | PRN

## 2015-07-22 NOTE — Telephone Encounter (Signed)
Scrip sent 

## 2015-07-22 NOTE — Telephone Encounter (Signed)
Pt notified Rx Verbalizes understanding

## 2015-07-22 NOTE — Telephone Encounter (Signed)
Dr Darlyn ReadStacks seen pt - in PM clinic appt 07/21/15.  Please address

## 2015-07-23 LAB — D-DIMER, QUANTITATIVE (NOT AT ARMC): D-DIMER: 0.28 mg{FEU}/L (ref 0.00–0.49)

## 2015-07-30 ENCOUNTER — Encounter (HOSPITAL_COMMUNITY): Payer: Self-pay | Admitting: Emergency Medicine

## 2015-07-30 ENCOUNTER — Emergency Department (HOSPITAL_COMMUNITY)
Admission: EM | Admit: 2015-07-30 | Discharge: 2015-07-30 | Disposition: A | Discharge status: Home / Self Care | Payer: BC Managed Care – PPO | Attending: Emergency Medicine | Admitting: Emergency Medicine

## 2015-07-30 ENCOUNTER — Emergency Department (HOSPITAL_COMMUNITY): Payer: BC Managed Care – PPO

## 2015-07-30 DIAGNOSIS — Z7952 Long term (current) use of systemic steroids: Secondary | ICD-10-CM | POA: Diagnosis not present

## 2015-07-30 DIAGNOSIS — F419 Anxiety disorder, unspecified: Secondary | ICD-10-CM | POA: Insufficient documentation

## 2015-07-30 DIAGNOSIS — Z792 Long term (current) use of antibiotics: Secondary | ICD-10-CM | POA: Insufficient documentation

## 2015-07-30 DIAGNOSIS — I10 Essential (primary) hypertension: Secondary | ICD-10-CM | POA: Diagnosis not present

## 2015-07-30 DIAGNOSIS — Z7951 Long term (current) use of inhaled steroids: Secondary | ICD-10-CM | POA: Diagnosis not present

## 2015-07-30 DIAGNOSIS — Z79899 Other long term (current) drug therapy: Secondary | ICD-10-CM | POA: Insufficient documentation

## 2015-07-30 DIAGNOSIS — R55 Syncope and collapse: Secondary | ICD-10-CM | POA: Insufficient documentation

## 2015-07-30 DIAGNOSIS — R42 Dizziness and giddiness: Secondary | ICD-10-CM | POA: Insufficient documentation

## 2015-07-30 DIAGNOSIS — R0602 Shortness of breath: Secondary | ICD-10-CM | POA: Insufficient documentation

## 2015-07-30 DIAGNOSIS — R059 Cough, unspecified: Secondary | ICD-10-CM

## 2015-07-30 DIAGNOSIS — G40909 Epilepsy, unspecified, not intractable, without status epilepticus: Secondary | ICD-10-CM | POA: Diagnosis not present

## 2015-07-30 DIAGNOSIS — E663 Overweight: Secondary | ICD-10-CM | POA: Insufficient documentation

## 2015-07-30 DIAGNOSIS — R05 Cough: Secondary | ICD-10-CM | POA: Diagnosis not present

## 2015-07-30 LAB — CBC
HCT: 36.8 % (ref 36.0–46.0)
HEMOGLOBIN: 12.1 g/dL (ref 12.0–15.0)
MCH: 28.5 pg (ref 26.0–34.0)
MCHC: 32.9 g/dL (ref 30.0–36.0)
MCV: 86.6 fL (ref 78.0–100.0)
Platelets: 232 10*3/uL (ref 150–400)
RBC: 4.25 MIL/uL (ref 3.87–5.11)
RDW: 14.3 % (ref 11.5–15.5)
WBC: 9.2 10*3/uL (ref 4.0–10.5)

## 2015-07-30 LAB — BASIC METABOLIC PANEL
ANION GAP: 10 (ref 5–15)
BUN: 15 mg/dL (ref 6–20)
CHLORIDE: 102 mmol/L (ref 101–111)
CO2: 23 mmol/L (ref 22–32)
CREATININE: 1.06 mg/dL — AB (ref 0.44–1.00)
Calcium: 9.2 mg/dL (ref 8.9–10.3)
GFR calc non Af Amer: >60 mL/min (ref >60)
Glucose, Bld: 91 mg/dL (ref 65–99)
POTASSIUM: 3.4 mmol/L — AB (ref 3.5–5.1)
SODIUM: 135 mmol/L (ref 135–145)

## 2015-07-30 LAB — I-STAT BETA HCG BLOOD, ED (MC, WL, AP ONLY)

## 2015-07-30 LAB — D-DIMER, QUANTITATIVE (NOT AT ARMC)

## 2015-07-30 MED ORDER — HYDROCOD POLST-CPM POLST ER 10-8 MG/5ML PO SUER: 5.0000 mL | Freq: Two times a day (BID) | ORAL | Status: DC | PRN

## 2015-07-30 NOTE — ED Provider Notes (Signed)
CSN: [id number]     Arrival date & time 07/30/15  [id number] History   First MD Initiated Contact with Patient 07/30/15 0945     Chief Complaint  Patient presents with  . Shortness of Breath  . Near Syncope   HPI Patient presents to the emergency room for an evaluation of a near syncopal spell. Patient states symptoms started several weeks ago with coughing and respiratory issues.  She has been seen by a cardiologist as well as her primary care doctor. She was treated with antibiotics and steroids. Patient states she continues to cough and feel short of breath. Symptoms have not improved with treatment. Today she was at work. She was walking down the hall and then began coughing. She started to feel short of breath and lightheaded. Patient felt like she was going to pass out. She went down to the ground but did not injure herself. Coworkers called EMS. Patient does not feel short of breath right now but does note that she gets more easily winded when she walks around. She had some chest pain earlier but not now. No leg swelling. No fevers or chills.  Past Medical History  Diagnosis Date  . Anxiety     no meds while preg.  . Polycystic disease, ovaries   . Seizures [facility](HCC)     March 2013  . Hypertension   . Epilepsy [facility](HCC)    Past Surgical History  Procedure Laterality Date  . Cesarean section      2008, 2010, 2013  . Cesarean section  08/15/2012    Procedure: CESAREAN SECTION;  Surgeon: Leslie AndreaJames E Tomblin II, MD;  Location: [facility] ORS;  Service: Obstetrics;  Laterality: N/A;  . Tubal ligation  08/15/2012    Procedure: BILATERAL TUBAL LIGATION;  Surgeon: Leslie AndreaJames E Tomblin II, MD;  Location: [facility] ORS;  Service: Obstetrics;  Laterality: Bilateral;   Family History  Problem Relation Age of Onset  . Birth defects Maternal Aunt   . Heart disease Maternal Grandmother   . Heart disease Maternal Grandfather   . Diabetes Paternal Grandmother   . Heart disease Mother     MI at age 44  . Cancer Mother   .  Bipolar disorder Mother   . Diabetes Father   . Pancreatitis Father   . Stroke Father   . Depression Father    Social History  Substance Use Topics  . Smoking status: Never Smoker   . Smokeless tobacco: None  . Alcohol Use: Yes     Comment: rare   OB History    Gravida Para Term Preterm AB TAB SAB Ectopic Multiple Living   9 6 5  2  2   2      Review of Systems  All other systems reviewed and are negative.     Allergies  Review of patient's allergies indicates no known allergies.  Home Medications   Prior to Admission medications   Medication Sig Start Date End Date Taking? Authorizing Provider  albuterol (PROVENTIL HFA;VENTOLIN HFA) 108 (90 BASE) MCG/ACT inhaler Inhale 2 puffs into the lungs every 6 (six) hours as needed for wheezing or shortness of breath. 07/22/15  Yes Mechele ClaudeWarren Stacks, MD  carbamazepine (TEGRETOL) 200 MG tablet TAKE 1 TABLET (200 MG TOTAL) BY MOUTH AT BEDTIME. 07/02/15  Yes Elenora GammaSamuel L Bradshaw, MD  clonazePAM (KLONOPIN) 0.5 MG disintegrating tablet Take 1 tablet (0.5 mg total) by mouth daily as needed for seizure. 06/19/15  Yes Elenora GammaSamuel L Bradshaw, MD  fluticasone (FLONASE) 50 MCG/ACT nasal  spray Place 2 sprays into both nostrils daily. 07/18/15  Yes Ernestina Penna, MD  hydrochlorothiazide (MICROZIDE) 12.5 MG capsule TAKE 1 TABLET (12.5 MG TOTAL) BY MOUTH DAILY. 07/16/15  Yes Elenora Gamma, MD  labetalol (NORMODYNE) 200 MG tablet TAKE 1 TABLET (200 MG TOTAL) BY MOUTH DAILY. 11/10/14  Yes Ernestina Penna, MD  metFORMIN (GLUCOPHAGE) 1000 MG tablet Take 1 tablet (1,000 mg total) by mouth 2 (two) times daily with a meal. 12/10/14  Yes Junie Spencer, FNP  moxifloxacin (AVELOX) 400 MG tablet Take 1 tablet (400 mg total) by mouth daily at 8 pm. 07/21/15  Yes Mechele Claude, MD  predniSONE (DELTASONE) 10 MG tablet 1 tablet 4 times a day for 2 days,  1 tablet 3 times a day for 2 days,  1 tablet 2 times a day for 2 days, 1 tablet daily for 2 days 07/18/15  Yes Ernestina Penna,  MD  benzonatate (TESSALON) 100 MG capsule Take 1 capsule (100 mg total) by mouth 2 (two) times daily as needed for cough. Patient not taking: Reported on 07/30/2015 07/16/15   Elenora Gamma, MD  chlorpheniramine-HYDROcodone [facility]) 10-8 MG/5ML SUER Take 5 mLs by mouth every 12 (twelve) hours as needed for cough. 07/30/15   Linwood Dibbles, MD  HYDROcodone-homatropine [facility]) 5-1.5 MG/5ML syrup Take 5 mLs by mouth every 6 (six) hours as needed for cough. Patient not taking: Reported on 07/30/2015 07/21/15   Mechele Claude, MD  pantoprazole (PROTONIX) 40 MG tablet TAKE 1 TABLET (40 MG TOTAL) BY MOUTH DAILY AS NEEDED. FOR HEARTBURN Patient not taking: Reported on 07/30/2015 02/16/15   Junie Spencer, FNP  phentermine 37.5 MG capsule Take 1 capsule (37.5 mg total) by mouth every morning. Patient not taking: Reported on 07/30/2015 06/19/15   Elenora Gamma, MD   BP 108/79 mmHg  Pulse 59  Temp(Src) 98.2 F (36.8 C) (Oral)  Resp 15  Ht  (1.727 m)  Wt 202 lb (91.627 kg)  BMI 30.72 kg/m2  SpO2 100%  LMP 07/02/2015 Physical Exam  Constitutional: She appears well-developed and well-nourished. No distress.  Overweight  HENT:  Head: Normocephalic and atraumatic.  Right Ear: External ear normal.  Left Ear: External ear normal.  Eyes: Conjunctivae are normal. Right eye exhibits no discharge. Left eye exhibits no discharge. No scleral icterus.  Neck: Neck supple. No tracheal deviation present.  Cardiovascular: Normal rate, regular rhythm and intact distal pulses.   Pulmonary/Chest: Effort normal and breath sounds normal. No stridor. No respiratory distress. She has no wheezes. She has no rales.  Abdominal: Soft. Bowel sounds are normal. She exhibits no distension. There is no tenderness. There is no rebound and no guarding.  Musculoskeletal: She exhibits no edema or tenderness.  Neurological: She is alert. She has normal strength. No cranial nerve deficit (no facial droop,  extraocular movements intact, no slurred speech) or sensory deficit. She exhibits normal muscle tone. She displays no seizure activity. Coordination normal.  Skin: Skin is warm and dry. No rash noted.  Psychiatric: She has a normal mood and affect.  Nursing note and vitals reviewed.   ED Course  Procedures (including critical care time) Labs Review Labs Reviewed  BASIC METABOLIC PANEL - Abnormal; Notable for the following:    Potassium 3.4 (*)    Creatinine, Ser 1.06 (*)    All other components within normal limits  CBC  D-DIMER, QUANTITATIVE (NOT AT [facility])  I-STAT BETA HCG BLOOD, ED ([facility], [facility], [facility] ONLY)  Imaging Review Dg Chest 2 View  07/30/2015  CLINICAL DATA:  Shortness of Breath EXAM: CHEST  2 VIEW COMPARISON:  07/21/2015 FINDINGS: Cardiomediastinal silhouette is stable. No acute infiltrate or pleural effusion. No pulmonary edema. Bony thorax is stable. IMPRESSION: No active cardiopulmonary disease. Electronically Signed   By: Natasha Mead M.D.   On: 07/30/2015 10:29   I have personally reviewed and evaluated these images and lab results as part of my medical decision-making.   EKG Interpretation   Date/Time:  Thursday July 30 2015 09:45:22 EST Ventricular Rate:  64 PR Interval:  161 QRS Duration: 89 QT Interval:  389 QTC Calculation: 401 R Axis:   28 Text Interpretation:  Sinus rhythm Low voltage, precordial leads No old  tracing to compare Confirmed by Kaizer Dissinger  MD-J, Reilly Blades ([id number]) on 07/30/2015  10:09:35 AM      MDM   Final diagnoses:  Cough  Near syncope    The patient's evaluation in the emergency department is reassuring. No evidence of pneumonia on x-ray. Low risk for PE and d-dimer is negative. Shows a normal rhythm. Laboratory tests are unremarkable.  Patient may have had a vasovagal episode associated with her coughing.  We discussed outpatient follow-up with her primary doctor and consider seeing a pulmonary doctor for this persistent cough.  At this  time there does not appear to be any evidence of an acute emergency medical condition and the patient appears stable for discharge with appropriate outpatient follow up.     Linwood Dibbles, MD 07/30/15 [phone]

## 2015-07-30 NOTE — Discharge Instructions (Signed)

## 2015-07-30 NOTE — ED Notes (Signed)
Patient states has had cough x 2 weeks.  Patient states dry, hacking cough.  Patient states has shortness of breath and gets dizzy at times when she starts coughing.  Per EMS, patient was at work and had a coughing spell, got a little dizzy, sat down and called EMS.   Patient states had some chest pain at the time, but resolved.

## 2015-08-12 ENCOUNTER — Telehealth: Payer: Self-pay | Admitting: Family Medicine

## 2015-09-08 ENCOUNTER — Other Ambulatory Visit: Payer: Self-pay | Admitting: Family

## 2015-09-17 ENCOUNTER — Telehealth: Payer: Self-pay | Admitting: Family Medicine

## 2015-10-12 ENCOUNTER — Encounter (INDEPENDENT_AMBULATORY_CARE_PROVIDER_SITE_OTHER): Payer: Self-pay

## 2015-10-12 ENCOUNTER — Encounter: Payer: Self-pay | Admitting: Internal Medicine

## 2015-10-12 ENCOUNTER — Ambulatory Visit (INDEPENDENT_AMBULATORY_CARE_PROVIDER_SITE_OTHER): Payer: BC Managed Care – PPO | Admitting: Internal Medicine

## 2015-10-12 VITALS — BP 146/96 | HR 99 | Ht 68.25 in | Wt 207.4 lb

## 2015-10-12 DIAGNOSIS — I10 Essential (primary) hypertension: Secondary | ICD-10-CM

## 2015-10-12 DIAGNOSIS — R0789 Other chest pain: Secondary | ICD-10-CM

## 2015-10-12 DIAGNOSIS — R05 Cough: Secondary | ICD-10-CM

## 2015-10-12 DIAGNOSIS — R058 Other specified cough: Secondary | ICD-10-CM | POA: Insufficient documentation

## 2015-10-12 MED ORDER — PREDNISONE 10 MG PO TABS: ORAL_TABLET | ORAL | Status: DC

## 2015-10-12 MED ORDER — PANTOPRAZOLE SODIUM 40 MG PO TBEC: 40.0000 mg | DELAYED_RELEASE_TABLET | Freq: Every day | ORAL | Status: DC

## 2015-10-12 MED ORDER — NEBIVOLOL HCL 10 MG PO TABS: 10.0000 mg | ORAL_TABLET | Freq: Every day | ORAL | Status: DC

## 2015-10-12 MED ORDER — FAMOTIDINE 20 MG PO TABS: ORAL_TABLET | ORAL | Status: DC

## 2015-10-12 NOTE — Patient Instructions (Addendum)
Please remember to go to the lab and x-ray department downstairs for your tests - we will call you with the results when they are available.  Stop normodyne (labetolol) and start bystolic 10 mg one daily   Prednisone 10 mg take  4 each am x 2 days,   2 each am x 2 days,  1 each am x 2 days and stop    Pantoprazole (protonix) 40 mg   Take  30-60 min before first meal of the day and Pepcid (famotidine)  20 mg one @  bedtime until return to office - this is the best way to tell whether stomach acid is contributing to your problem.    GERD (REFLUX)  is an extremely common cause of respiratory symptoms just like yours , many times with no obvious heartburn at all.    It can be treated with medication, but also with lifestyle changes including elevation of the head of your bed (ideally with 6 inch  bed blocks),  Smoking cessation, avoidance of late meals, excessive alcohol, and avoid fatty foods, chocolate, peppermint, colas, red wine, and acidic juices such as orange juice.  NO MINT OR MENTHOL PRODUCTS SO NO COUGH DROPS  USE SUGARLESS CANDY INSTEAD (Jolley ranchers or Stover's or Life Savers) or even ice chips will also do - the key is to swallow to prevent all throat clearing. NO OIL BASED VITAMINS - use powdered substitutes.  Ok to over the counter or delsym  2 tsp every 12 hours as needed    Please schedule a follow up office visit in 2  weeks, sooner if needed

## 2015-10-12 NOTE — Progress Notes (Signed)
Subjective:    Patient ID: Kathleen Blake, female    DOB: January 10, 1971,    MRN: [id number]  HPI  61 yowf never smoker has had problems with wt all her life but worked off 120 lb on [employer] in 2005 able to run 3 miles s stopping which included 2 of pregnancies but since the third pregancy while pregnant 2012 gained back 35 lb and during that pregnancy and after that never resumed running but did walk regularly and lost 15/35 attributed to phenteramine and maintained same wt since then  bothered intermittent severe cough since last  baby born 2013  then worse sob since Oct 2016 which can occur @ rest of talking and loses breath variably with exertion  so referred  10/13/2015 to pulmonary clinica by Dr Ermalinda Memos.   10/12/2015 1st [place] [facility] office visit/ [facility]   Chief Complaint  Patient presents with  . Pulmonary Consult    Referred by Dr. Kevin Fenton. Pt c/o SOB, cough, and chest discomfort since Oct 2016. Cough is non prod. She states that SOB comes and goes with no specific trigger. She only notices chest discomfort when she gets SOB.    feels tickle at top of ss notch day >> noct/ sob not proportional to activity   No obvious  patterns in day to day or daytime variabilty or assoc  Lateralizing or pleuritic cp/ subjective wheeze overt sinus or hb symptoms. No unusual exp hx or h/o childhood pna/ asthma or knowledge of premature birth.  Sleeping ok without nocturnal  or early am exacerbation  of respiratory  c/o's or need for noct saba. Also denies any obvious fluctuation of symptoms with weather or environmental changes or other aggravating or alleviating factors except as outlined above   Current Medications, Allergies, Complete Past Medical History, Past Surgical History, Family History, and Social History were reviewed in [employer] record.          Review of Systems  Constitutional: Negative for fever, chills and unexpected weight change.    HENT: Negative for congestion, dental problem, ear pain, nosebleeds, postnasal drip, rhinorrhea, sinus pressure, sneezing, sore throat, trouble swallowing and voice change.   Eyes: Negative for visual disturbance.  Respiratory: Positive for cough and shortness of breath. Negative for choking.   Cardiovascular: Negative for chest pain and leg swelling.  Gastrointestinal: Negative for vomiting, abdominal pain and diarrhea.  Genitourinary: Negative for difficulty urinating.  Musculoskeletal: Negative for arthralgias.  Skin: Negative for rash.  Neurological: Negative for tremors, syncope and headaches.  Hematological: Does not bruise/bleed easily.       Objective:   Physical Exam   amb wf with very harsh upper airway cough on insp   Wt Readings from Last 3 Encounters:  10/12/15 207 lb 6.4 oz (94.076 kg)  07/30/15 202 lb (91.627 kg)  07/21/15 203 lb (92.08 kg)    Vital signs reviewed   HEENT: nl dentition, turbinates, and oropharynx. Nl external ear canals without cough reflex   NECK :  without JVD/Nodes/TM/ nl carotid upstrokes bilaterally   LUNGS: no acc muscle use,  Nl contour chest which is clear to A and P bilaterally without cough on insp or exp maneuvers   CV:  RRR  no s3 or murmur or increase in P2, no edema   ABD:  soft and nontender with nl inspiratory excursion in the supine position. No bruits or organomegaly, bowel sounds nl  MS:  Nl gait/ ext warm without deformities, calf  tenderness, cyanosis or clubbing No obvious joint restrictions   SKIN: warm and dry without lesions    NEURO:  alert, approp, nl sensorium with  no motor deficits  Labs 10/12/2015 >> cbc with diff / allergy profile    I personally reviewed images and agree with radiology impression as follows:  CXR:  09/28/14 No active cardiopulmonary disease.         Assessment & Plan:

## 2015-10-13 DIAGNOSIS — R0789 Other chest pain: Secondary | ICD-10-CM | POA: Insufficient documentation

## 2015-10-13 NOTE — Assessment & Plan Note (Signed)
Since asthma is ddx Strongly prefer in this setting: Bystolic, the most beta -1  selective Beta blocker available in sample form, with bisoprolol the most selective generic choice  on the market.   Try bistolic 10 mg daily as not taking the bid dose of normodyne anyway and bp high today p last dose > 12 h prior to OV

## 2015-10-13 NOTE — Assessment & Plan Note (Signed)
Likely from coughing or cp from gerd but nothing classically pleuritic or exertional and will address again when returns for f/u in 2 weeks

## 2015-10-13 NOTE — Assessment & Plan Note (Addendum)
The most common causes of chronic cough in immunocompetent adults include the following: upper airway cough syndrome (UACS), previously referred to as postnasal drip syndrome (PNDS), which is caused by variety of rhinosinus conditions; (2) asthma; (3) GERD; (4) chronic bronchitis from cigarette smoking or other inhaled environmental irritants; (5) nonasthmatic eosinophilic bronchitis; and (6) bronchiectasis.   These conditions, singly or in combination, have accounted for up to 94% of the causes of chronic cough in prospective studies.   Other conditions have constituted no >6% of the causes in prospective studies These have included bronchogenic carcinoma, chronic interstitial pneumonia, sarcoidosis, left ventricular failure, ACEI-induced cough, and aspiration from a condition associated with pharyngeal dysfunction.    Chronic cough is often simultaneously caused by more than one condition. A single cause has been found from 38 to 82% of the time, multiple causes from 18 to 62%. Multiply caused cough has been the result of three diseases up to 42% of the time.       Based on hx and exam, this is most likely:  Classic Upper airway cough syndrome, so named because it's frequently impossible to sort out how much is  CR/sinusitis with freq throat clearing (which can be related to primary GERD)   vs  causing  secondary (" extra esophageal")  GERD from wide swings in gastric pressure that occur with throat clearing, often  promoting self use of mint and menthol lozenges that reduce the lower esophageal sphincter tone and exacerbate the problem further in a cyclical fashion.   These are the same pts (now being labeled as having "irritable larynx syndrome" by some cough centers) who not infrequently have a history of having failed to tolerate ace inhibitors,  dry powder inhalers or biphosphonates or report having atypical reflux symptoms that don't respond to standard doses of PPI , and are easily confused as  having aecopd or asthma flares by even experienced allergists/ pulmonologists.   The first step is to maximize acid suppression and eliminate normodyne (in case this is cough variant asthma)  then regroup if the cough persists with sinus ct/ allergy profile in meantime.  I had an extended discussion with the patient reviewing all relevant studies completed to date and  lasting 35 min  1) Explained: The standardized cough guidelines published in Chest by [place] in 2006 are still the best available and consist of a multiple step process (up to 12!) , not a single office visit,  and are intended  to address this problem logically,  with an alogrithm dependent on response to empiric treatment at  each progressive step  to determine a specific diagnosis with  minimal addtional testing needed. Therefore if adherence is an issue or can't be accurately verified,  it's very unlikely the standard evaluation and treatment will be successful here.    Furthermore, response to therapy (other than acute cough suppression, which should only be used short term with avoidance of narcotic containing cough syrups if possible), can be a gradual process for which the patient may perceive immediate benefit.  Unlike going to an eye doctor where the best perscription is almost always the first one and is immediately effective, this is almost never the case in the management of chronic cough syndromes. Therefore the patient needs to commit up front to consistently adhere to recommendations  for up to 6 weeks of therapy directed at the likely underlying problem(s) before the response can be reasonably evaluated.     2) Each maintenance medication was reviewed  in detail including most importantly the difference between maintenance and prns and under what circumstances the prns are to be triggered using an action plan format that is not reflected in the computer generated alphabetically organized AVS.    Please see  instructions for details which were reviewed in writing and the patient given a copy highlighting the part that I personally wrote and discussed at today's ov.   See instructions for specific recommendations which were reviewed directly with the patient who was given a copy with highlighter outlining the key components.

## 2015-10-19 ENCOUNTER — Inpatient Hospital Stay: Admission: RE | Admit: 2015-10-19 | Payer: BC Managed Care – PPO | Source: Ambulatory Visit

## 2015-10-19 ENCOUNTER — Ambulatory Visit (INDEPENDENT_AMBULATORY_CARE_PROVIDER_SITE_OTHER)
Admission: RE | Admit: 2015-10-19 | Discharge: 2015-10-19 | Disposition: A | Discharge status: Home / Self Care | Payer: BC Managed Care – PPO | Source: Ambulatory Visit | Attending: Internal Medicine | Admitting: Internal Medicine

## 2015-10-19 DIAGNOSIS — R05 Cough: Secondary | ICD-10-CM | POA: Diagnosis not present

## 2015-10-20 ENCOUNTER — Telehealth: Payer: Self-pay | Admitting: Internal Medicine

## 2015-10-20 NOTE — Telephone Encounter (Signed)
Result Note     Call patient : Study is unremarkable, no change in recs- be sure has f/u if not doing better  ---  I spoke with patient about results and she verbalized understanding and had no questions

## 2015-10-20 NOTE — Progress Notes (Signed)
Quick Note:  LMTCB ______ 

## 2015-10-20 NOTE — Telephone Encounter (Signed)
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  Asking if she can please call her back today.

## 2015-10-22 ENCOUNTER — Telehealth: Payer: Self-pay | Admitting: Internal Medicine

## 2015-10-22 NOTE — Telephone Encounter (Signed)
Pt is requesting a letter be written for her gym membership to be placed on hold/frozen until she gets better and breathing improves.  Available for call back around 9am - leave detailed message if get voicemail.   Please advise Dr Sherene Sires. Thanks.

## 2015-10-23 NOTE — Telephone Encounter (Signed)
Pt aware that Dr Sherene Sires is okay with [place] writing the letter . Pt requests that we fax this letter to [phone]  ATTN: Morey Hummingbird  There needs to be specific time frame as to how long her membership will need to be frozen for - how many weeks/months will this patient not be able to work out based on your last evaluation? And is there anything specific you would like for [place] to put in the letter? Needs letter faxed ASAP.

## 2015-10-23 NOTE — Telephone Encounter (Signed)
Do 3 months then we can look at again then

## 2015-10-23 NOTE — Telephone Encounter (Signed)
[phone] Pat calling back

## 2015-10-23 NOTE — Telephone Encounter (Signed)
Ok to write letter accordingly

## 2015-10-23 NOTE — Telephone Encounter (Signed)
Left message for patient to call back  

## 2015-10-26 ENCOUNTER — Encounter: Payer: Self-pay | Admitting: *Deleted

## 2015-10-26 NOTE — Telephone Encounter (Signed)
Letter has been written and printed. This has been placed in MW's look at to sign.

## 2015-10-27 NOTE — Telephone Encounter (Signed)
Patient states she needs this form sent to her gym today if possible so they don't charge her card tomorrow. CB [phone]

## 2015-10-27 NOTE — Telephone Encounter (Signed)
Letter has been signed and faxed to pt below. She is aware and nothing further needed

## 2015-10-29 ENCOUNTER — Ambulatory Visit (INDEPENDENT_AMBULATORY_CARE_PROVIDER_SITE_OTHER): Payer: BC Managed Care – PPO | Admitting: Internal Medicine

## 2015-10-29 ENCOUNTER — Encounter: Payer: Self-pay | Admitting: Internal Medicine

## 2015-10-29 VITALS — BP 134/82 | HR 89 | Ht 68.25 in | Wt 207.0 lb

## 2015-10-29 DIAGNOSIS — I10 Essential (primary) hypertension: Secondary | ICD-10-CM | POA: Diagnosis not present

## 2015-10-29 DIAGNOSIS — R05 Cough: Secondary | ICD-10-CM | POA: Diagnosis not present

## 2015-10-29 DIAGNOSIS — R058 Other specified cough: Secondary | ICD-10-CM

## 2015-10-29 MED ORDER — TRAMADOL HCL 50 MG PO TABS: ORAL_TABLET | ORAL | Status: DC

## 2015-10-29 MED ORDER — PREDNISONE 10 MG PO TABS: ORAL_TABLET | ORAL | Status: DC

## 2015-10-29 NOTE — Assessment & Plan Note (Signed)
Changed normodyne to bystolic 10/12/2015 >>> no change in symptoms but bp good on bystolic and we have not yet excluded cough variant asthma > continue bystolic 10 mg per day samples for now

## 2015-10-29 NOTE — Progress Notes (Signed)
Subjective:    Patient ID: Kathleen Blake, female    DOB: 18-Sep-1970    MRN: [id number]    Brief patient profile:  31 yowf never smoker has had problems with wt all her life but worked off 120 lb on [employer] in 2005 able to run 3 miles s stopping which included 2 of pregnancies but since the third pregancy while pregnant 2012 gained back 35 lb and during that pregnancy and after that never resumed running but did walk regularly and lost 15/35 attributed to phenteramine and maintained same wt since then  bothered intermittent severe cough since last  baby born 2013  then worse sob since Oct 2016 which can occur @ rest of talking and loses breath variably with exertion  so referred  10/13/2015 to pulmonary clinic  by Dr Ermalinda Memos.    History of Present Illness  10/12/2015 1st [facility] office visit/ Kathleen Blake   Chief Complaint  Patient presents with  . Pulmonary Consult    Referred by Dr. Kevin Fenton. Pt c/o SOB, cough, and chest discomfort since Oct 2016. Cough is non prod. She states that SOB comes and goes with no specific trigger. She only notices chest discomfort when she gets SOB.   feels tickle at top of ss notch day >> noct/ sob not proportional to activity  rec Please remember to go to the lab and x-ray department downstairs for your tests - we will call you with the results when they are available. Stop normodyne (labetolol) and start bystolic 10 mg one daily  Prednisone 10 mg take  4 each am x 2 days,   2 each am x 2 days,  1 each am x 2 days and stop  Pantoprazole (protonix) 40 mg   Take  30-60 min before first meal of the day and Pepcid (famotidine)  20 mg one @  bedtime until return to office - this is the best way to tell whether stomach acid is contributing to your problem.   GERD diet  Ok to over the counter or delsym  2 tsp every 12 hours as needed  Please schedule a follow up office visit in 2  weeks, sooner if needed    10/29/2015  f/u ov/Kathleen Blake re: cough  since Oct 2016 / maint on rob "extra strength" plus pepcid hs/ not taking ppi  Chief Complaint  Patient presents with  . Follow-up    Pt states her symptoms are unchanged.     No sob unless coughing  No obvious day to day or daytime variability or assoc excess/ purulent sputum or mucus plugs or hemoptysis or cp or chest tightness, subjective wheeze or overt sinus or hb symptoms. No unusual exp hx or h/o childhood pna/ asthma or knowledge of premature birth.  Sleeping ok p robittussin without nocturnal  or early am exacerbation  of respiratory  c/o's or need for noct saba. Also denies any obvious fluctuation of symptoms with weather or environmental changes or other aggravating or alleviating factors except as outlined above   Current Medications, Allergies, Complete Past Medical History, Past Surgical History, Family History, and Social History were reviewed in [employer] record.  ROS  The following are not active complaints unless bolded sore throat, dysphagia, dental problems, itching, sneezing,  nasal congestion or excess/ purulent secretions, ear ache,   fever, chills, sweats, unintended wt loss, classically pleuritic or exertional cp,  orthopnea pnd or leg swelling, presyncope, palpitations, abdominal pain, anorexia, nausea, vomiting, diarrhea  or change in bowel or bladder habits, change in stools or urine, dysuria,hematuria,  rash, arthralgias, visual complaints, headache, numbness, weakness or ataxia or problems with walking or coordination,  change in mood/affect or memory.                        Objective:   Physical Exam   amb wf with very harsh upper airway cough on insp    10/29/2015     207   10/12/15 207 lb 6.4 oz (94.076 kg)  07/30/15 202 lb (91.627 kg)  07/21/15 203 lb (92.08 kg)    Vital signs reviewed   HEENT: nl dentition, turbinates, and oropharynx. Nl external ear canals without cough reflex   NECK :  without JVD/Nodes/TM/ nl  carotid upstrokes bilaterally   LUNGS: no acc muscle use,  Nl contour chest which is clear to A and P bilaterally without cough on insp or exp maneuvers   CV:  RRR  no s3 or murmur or increase in P2, no edema   ABD:  soft and nontender with nl inspiratory excursion in the supine position. No bruits or organomegaly, bowel sounds nl  MS:  Nl gait/ ext warm without deformities, calf tenderness, cyanosis or clubbing No obvious joint restrictions   SKIN: warm and dry without lesions    NEURO:  alert, approp, nl sensorium with  no motor deficits       I personally reviewed images and agree with radiology impression as follows:  CXR:  09/28/14 No active cardiopulmonary disease.         Assessment & Plan:

## 2015-10-29 NOTE — Patient Instructions (Addendum)
Protonix 40 mg Take 30-60 min before first meal of the day and pepcid ac 20 mg an hour before bedtime   For drainage / throat tickle try take CHLORPHENIRAMINE  4 mg - take one every 4 hours as needed - available over the counter- may cause drowsiness so start with just a bedtime dose or two and see how you tolerate it before trying in daytime    Take delsym two tsp every 12 hours and supplement if needed with  tramadol 50 mg up to 1-2 every 4 hours to suppress the urge to cough. Swallowing water or using ice chips/non mint and menthol containing candies (such as lifesavers or sugarless jolly ranchers) are also effective.  You should rest your voice and avoid activities that you know make you cough.  Once you have eliminated the cough for 3 straight days try reducing the tramadol first,  then the delsym as tolerated.   Prednisone 10 mg take  4 each am x 2 days,   2 each am x 2 days,  1 each am x 2 days and stop   GERD (REFLUX)  is an extremely common cause of respiratory symptoms just like yours , many times with no obvious heartburn at all.    It can be treated with medication, but also with lifestyle changes including elevation of the head of your bed (ideally with 6 inch  bed blocks),  Smoking cessation, avoidance of late meals, excessive alcohol, and avoid fatty foods, chocolate, peppermint, colas, red wine, and acidic juices such as orange juice.  NO MINT OR MENTHOL PRODUCTS SO NO COUGH DROPS  USE SUGARLESS CANDY INSTEAD (Jolley ranchers or Stover's or Life Savers) or even ice chips will also do - the key is to swallow to prevent all throat clearing. NO OIL BASED VITAMINS - use powdered substitutes.    Please schedule a follow up office visit in 2 weeks, sooner if needed  Bring all your medications including the over the counters Late add never went to lab

## 2015-10-29 NOTE — Assessment & Plan Note (Signed)
Try off normodyne/ max rx for gerd 10/12/2015 >>> - Sinus CT 10/19/15 > No evidence of acute or chronic sinusitis  Did not follow instructions from prev ov.   I had an extended discussion with the patient reviewing all relevant studies completed to date and  lasting 15 to 20 minutes of a 25 minute visit on the following ongoing concerns:   1) The standardized cough guidelines published in Chest by [place] in 2006 are still the best available and consist of a multiple step process (up to 12!) , not a single office visit,  and are intended  to address this problem logically,  with an alogrithm dependent on response to empiric treatment at  each progressive step  to determine a specific diagnosis with  minimal addtional testing needed. Therefore if adherence is an issue or can't be accurately verified,  it's very unlikely the standard evaluation and treatment will be successful here.    Furthermore, response to therapy (other than acute cough suppression, which should only be used short term with avoidance of narcotic containing cough syrups if possible), can be a gradual process for which the patient may perceive immediate benefit.  Unlike going to an eye doctor where the best perscription is almost always the first one and is immediately effective, this is almost never the case in the management of chronic cough syndromes. Therefore the patient needs to commit up front to consistently adhere to recommendations  for up to 6 weeks of therapy directed at the likely underlying problem(s) before the response can be reasonably evaluated.   2) Rec try step #1 again using ppi as instructed and eliminate pnds with 1st gen h1 and short course pred only   3) Each maintenance medication was reviewed in detail including most importantly the difference between maintenance and as needed and under what circumstances the prns are to be used.  Please see instructions for details which were reviewed in writing and the  patient given a copy.

## 2015-10-30 ENCOUNTER — Ambulatory Visit: Payer: BC Managed Care – PPO | Admitting: Internal Medicine

## 2015-11-11 ENCOUNTER — Encounter: Payer: Self-pay | Admitting: Nurse Practitioner

## 2015-11-11 ENCOUNTER — Ambulatory Visit: Payer: BC Managed Care – PPO

## 2015-11-11 ENCOUNTER — Ambulatory Visit (INDEPENDENT_AMBULATORY_CARE_PROVIDER_SITE_OTHER): Payer: BC Managed Care – PPO | Admitting: Nurse Practitioner

## 2015-11-11 VITALS — BP 129/81 | HR 83 | Temp 100.6°F | Ht 68.0 in | Wt 203.8 lb

## 2015-11-11 DIAGNOSIS — R05 Cough: Secondary | ICD-10-CM

## 2015-11-11 DIAGNOSIS — R059 Cough, unspecified: Secondary | ICD-10-CM

## 2015-11-11 LAB — POCT INFLUENZA A/B
Influenza A, POC: POSITIVE — AB
Influenza B, POC: NEGATIVE

## 2015-11-11 MED ORDER — OSELTAMIVIR PHOSPHATE 75 MG PO CAPS: 75.0000 mg | ORAL_CAPSULE | Freq: Two times a day (BID) | ORAL | Status: DC

## 2015-11-11 NOTE — Progress Notes (Signed)
  Subjective:     Kathleen Blake is a 45 y.o. female who presents for evaluation of fever. She has had the fever for 2 days. Symptoms have been unchanged. Symptoms are described as headache, achiness, cough and headache, and are worse in the morning. Associated symptoms are body aches, chills, fatigue, headache, poor appetite and URI symptoms. Patient denies abdominal pain, diarrhea and vomiting.  She has tried to alleviate the symptoms with OTC decongestant with minimal relief. The patient has no known comorbidities (structural heart/valvular disease, prosthetic joints, immunocompromised state, recent dental work, known abscesses).  The following portions of the patient's history were reviewed and updated as appropriate: allergies, current medications, past family history, past medical history, past social history, past surgical history and problem list.  Review of Systems Pertinent items are noted in HPI.   Objective:    BP 129/81 mmHg  Pulse 83  Temp(Src) 100.6 F (38.1 C) (Oral)  Ht  (1.727 m)  Wt 203 lb 12.8 oz (92.443 kg)  BMI 30.99 kg/m2  LMP 10/15/2015 General appearance: alert and cooperative Eyes: conjunctivae/corneas clear. PERRL, EOM's intact. Fundi benign. Ears: normal TM's and external ear canals both ears Nose: clear discharge, moderate congestion, turbinates red, no sinus tenderness Throat: lips, mucosa, and tongue normal; teeth and gums normal Neck: no adenopathy, no carotid bruit, no JVD, supple, symmetrical, trachea midline and thyroid not enlarged, symmetric, no tenderness/mass/nodules Lungs: clear to auscultation bilaterally and deep dry cough Heart: regular rate and rhythm, S1, S2 normal, no murmur, click, rub or gallop   Results for orders placed or performed in visit on 11/11/15  POCT Influenza A/B  Result Value Ref Range   Influenza A, POC Positive (A) Negative   Influenza B, POC Negative Negative    Assessment:    Fever is likely secondary to  influenza.   Plan:  1. Take meds as prescribed 2. Use a cool mist humidifier especially during the winter months and when heat has been humid. 3. Use saline nose sprays frequently 4. Saline irrigations of the nose can be very helpful if done frequently.  * 4X daily for 1 week*  * Use of a nettie pot can be helpful with this. Follow directions with this* 5. Drink plenty of fluids 6. Keep thermostat turn down low 7.For any cough or congestion  Use plain Mucinex- regular strength or max strength is fine   * Children- consult with Pharmacist for dosing 8. For fever or aces or pains- take tylenol or ibuprofen appropriate for age and weight.  * for fevers greater than 101 orally you may alternate ibuprofen and tylenol every  3 hours.   Meds ordered this encounter  Medications  . oseltamivir (TAMIFLU) 75 MG capsule    Sig: Take 1 capsule (75 mg total) by mouth 2 (two) times daily.    Dispense:  10 capsule    Refill:  0    Order Specific Question:  Supervising Provider    Answer:  Ernestina Penna [[id number]]   Mary-Margaret Daphine Deutscher, FNP

## 2015-11-11 NOTE — Patient Instructions (Signed)

## 2015-11-12 ENCOUNTER — Other Ambulatory Visit: Payer: Self-pay | Admitting: Family

## 2015-11-13 ENCOUNTER — Ambulatory Visit: Payer: BC Managed Care – PPO | Admitting: Adult Health

## 2015-11-16 ENCOUNTER — Other Ambulatory Visit: Payer: Self-pay | Admitting: Internal Medicine

## 2015-11-16 MED ORDER — FAMOTIDINE 20 MG PO TABS: ORAL_TABLET | ORAL | Status: DC

## 2015-11-17 ENCOUNTER — Other Ambulatory Visit: Payer: Self-pay | Admitting: *Deleted

## 2015-11-17 DIAGNOSIS — Z79899 Other long term (current) drug therapy: Secondary | ICD-10-CM

## 2015-11-19 MED ORDER — CARBAMAZEPINE 200 MG PO TABS: 200.0000 mg | ORAL_TABLET | Freq: Every day | ORAL | Status: DC

## 2015-11-19 NOTE — Telephone Encounter (Signed)
Left detailed mssg on VM per ROI that refill was sent to pharmacy & order placed for CBC

## 2015-11-19 NOTE — Addendum Note (Signed)
Addended by: Fawn KirkHOLT, CATHY on: 11/19/2015 02:36 PM   Modules accepted: Orders

## 2015-12-04 ENCOUNTER — Encounter: Payer: BC Managed Care – PPO | Admitting: Adult Health

## 2015-12-16 ENCOUNTER — Telehealth: Payer: Self-pay | Admitting: Internal Medicine

## 2015-12-16 MED ORDER — NEBIVOLOL HCL 10 MG PO TABS: 10.0000 mg | ORAL_TABLET | Freq: Every day | ORAL | Status: DC

## 2015-12-16 NOTE — Telephone Encounter (Signed)
Patient needs refill on Bystolic. Rx sent to pharmacy. Patient aware.  Nothing further needed.

## 2016-01-01 ENCOUNTER — Encounter: Payer: Self-pay | Admitting: Adult Health

## 2016-01-01 ENCOUNTER — Ambulatory Visit (INDEPENDENT_AMBULATORY_CARE_PROVIDER_SITE_OTHER): Payer: BC Managed Care – PPO | Admitting: Adult Health

## 2016-01-01 ENCOUNTER — Encounter (INDEPENDENT_AMBULATORY_CARE_PROVIDER_SITE_OTHER): Payer: Self-pay

## 2016-01-01 VITALS — BP 100/72 | HR 67 | Temp 98.0°F | Ht 68.0 in | Wt 211.0 lb

## 2016-01-01 DIAGNOSIS — R05 Cough: Secondary | ICD-10-CM

## 2016-01-01 DIAGNOSIS — R058 Other specified cough: Secondary | ICD-10-CM

## 2016-01-01 NOTE — Progress Notes (Signed)
Subjective:    Patient ID: Kathleen Blake, female    DOB: 09-18-1970    MRN: [id number]    Brief patient profile:  47 yowf never smoker has had problems with wt all her life but worked off 120 lb on [employer] in 2005 able to run 3 miles s stopping which included 2 of pregnancies but since the third pregancy while pregnant 2012 gained back 35 lb and during that pregnancy and after that never resumed running but did walk regularly and lost 15/35 attributed to phenteramine and maintained same wt since then  bothered intermittent severe cough since last  baby born 2013  then worse sob since Oct 2016 which can occur @ rest of talking and loses breath variably with exertion  so referred  10/13/2015 to pulmonary clinic  by Dr Ermalinda Memos.    History of Present Illness  10/12/2015 1st [facility] office visit/ Wert   Chief Complaint  Patient presents with  . Pulmonary Consult    Referred by Dr. Kevin Fenton. Pt c/o SOB, cough, and chest discomfort since Oct 2016. Cough is non prod. She states that SOB comes and goes with no specific trigger. She only notices chest discomfort when she gets SOB.   feels tickle at top of ss notch day >> noct/ sob not proportional to activity  rec Please remember to go to the lab and x-ray department downstairs for your tests - we will call you with the results when they are available. Stop normodyne (labetolol) and start bystolic 10 mg one daily  Prednisone 10 mg take  4 each am x 2 days,   2 each am x 2 days,  1 each am x 2 days and stop  Pantoprazole (protonix) 40 mg   Take  30-60 min before first meal of the day and Pepcid (famotidine)  20 mg one @  bedtime until return to office - this is the best way to tell whether stomach acid is contributing to your problem.   GERD diet  Ok to over the counter or delsym  2 tsp every 12 hours as needed  Please schedule a follow up office visit in 2  weeks, sooner if needed    10/29/2015  f/u ov/Wert re: cough  since Oct 2016 / maint on rob "extra strength" plus pepcid hs/ not taking ppi  Chief Complaint  Patient presents with  . Follow-up    Pt states her symptoms are unchanged.    No sob unless coughing >>pred taper   01/01/2016 Follow up : Cough  Pt returns for 1 month follow up .  We reviewed all meds and organized them into a med calendar with pt education  Appears to be taking correctly.  Is using deslym and tramadol for cough . Some help but has lingering cough and wheezing .  Has not had PFT.  Denies chest pain, orthopnea, edeme , fever or hemoptysis .    Current Medications, Allergies, Complete Past Medical History, Past Surgical History, Family History, and Social History were reviewed in [employer] record.  ROS  The following are not active complaints unless bolded sore throat, dysphagia, dental problems, itching, sneezing,  nasal congestion or excess/ purulent secretions, ear ache,   fever, chills, sweats, unintended wt loss, classically pleuritic or exertional cp,  orthopnea pnd or leg swelling, presyncope, palpitations, abdominal pain, anorexia, nausea, vomiting, diarrhea  or change in bowel or bladder habits, change in stools or urine, dysuria,hematuria,  rash, arthralgias,  visual complaints, headache, numbness, weakness or ataxia or problems with walking or coordination,  change in mood/affect or memory.                        Objective:   Physical Exam   amb wf  nad  Filed Vitals:   01/01/16 1632  BP: 100/72  Pulse: 67  Temp: 98 F (36.7 C)  TempSrc: Oral  Height: 5\' 8"  (1.727 m)  Weight: 211 lb (95.709 kg)  SpO2: 100%      Vital signs reviewed   HEENT: nl dentition, turbinates, and oropharynx. Nl external ear canals without cough reflex   NECK :  without JVD/Nodes/TM/ nl carotid upstrokes bilaterally   LUNGS: no acc muscle use,  Nl contour chest which is clear to A and P bilaterally without cough on insp or exp  maneuvers   CV:  RRR  no s3 or murmur or increase in P2, no edema   ABD:  soft and nontender with nl inspiratory excursion in the supine position. No bruits or organomegaly, bowel sounds nl  MS:  Nl gait/ ext warm without deformities, calf tenderness, cyanosis or clubbing No obvious joint restrictions   SKIN: warm and dry without lesions    NEURO:  alert, approp, nl sensorium with  no motor deficits        CXR:  09/28/14 No active cardiopulmonary disease.   Tammy Parrett NP-C   Pulmonary and Critical Care  01/01/2016       Assessment & Plan:

## 2016-01-01 NOTE — Patient Instructions (Addendum)
Follow med calendar closely and bring to each visit .  Follow up with Dr. Wert  In 3 weeks with PFT  Please contact office for sooner follow up if symptoms do not improve or worsen or seek emergency care    

## 2016-01-01 NOTE — Assessment & Plan Note (Signed)
Follow med calendar closely and bring to each visit .  Follow up with Dr. Sherene SiresWert  In 3 weeks with PFT  Please contact office for sooner follow up if symptoms do not improve or worsen or seek emergency care

## 2016-01-11 ENCOUNTER — Other Ambulatory Visit: Payer: Self-pay | Admitting: Family

## 2016-01-13 ENCOUNTER — Other Ambulatory Visit: Payer: Self-pay | Admitting: Family Medicine

## 2016-01-15 ENCOUNTER — Telehealth: Payer: Self-pay | Admitting: Family Medicine

## 2016-01-15 NOTE — Telephone Encounter (Signed)
Patient aware and made apt tomorrow to be seen

## 2016-01-15 NOTE — Telephone Encounter (Signed)
Unfortunately, because this is a controlled substance, I would like her to be seen prior to refill.  Murtis SinkSam Blake Goya, MD [facility] 01/15/2016, 6:28 PM

## 2016-01-16 ENCOUNTER — Ambulatory Visit: Payer: Self-pay

## 2016-01-21 ENCOUNTER — Ambulatory Visit: Payer: BC Managed Care – PPO | Admitting: Family

## 2016-02-01 ENCOUNTER — Encounter: Payer: Self-pay | Admitting: Family

## 2016-02-01 ENCOUNTER — Ambulatory Visit (INDEPENDENT_AMBULATORY_CARE_PROVIDER_SITE_OTHER): Payer: BC Managed Care – PPO | Admitting: Family

## 2016-02-01 VITALS — BP 111/76 | HR 69 | Temp 97.7°F | Ht 68.0 in | Wt 216.0 lb

## 2016-02-01 DIAGNOSIS — Z1239 Encounter for other screening for malignant neoplasm of breast: Secondary | ICD-10-CM

## 2016-02-01 DIAGNOSIS — I10 Essential (primary) hypertension: Secondary | ICD-10-CM | POA: Diagnosis not present

## 2016-02-01 DIAGNOSIS — E282 Polycystic ovarian syndrome: Secondary | ICD-10-CM | POA: Diagnosis not present

## 2016-02-01 DIAGNOSIS — F411 Generalized anxiety disorder: Secondary | ICD-10-CM

## 2016-02-01 DIAGNOSIS — G40909 Epilepsy, unspecified, not intractable, without status epilepticus: Secondary | ICD-10-CM

## 2016-02-01 DIAGNOSIS — Z713 Dietary counseling and surveillance: Secondary | ICD-10-CM

## 2016-02-01 DIAGNOSIS — K219 Gastro-esophageal reflux disease without esophagitis: Secondary | ICD-10-CM | POA: Diagnosis not present

## 2016-02-01 DIAGNOSIS — Z23 Encounter for immunization: Secondary | ICD-10-CM | POA: Diagnosis not present

## 2016-02-01 MED ORDER — ESCITALOPRAM OXALATE 10 MG PO TABS: 10.0000 mg | ORAL_TABLET | Freq: Every day | ORAL | Status: DC

## 2016-02-01 MED ORDER — PHENTERMINE HCL 37.5 MG PO CAPS: 37.5000 mg | ORAL_CAPSULE | ORAL | Status: DC

## 2016-02-01 NOTE — Progress Notes (Signed)
Subjective:    Patient ID: Kathleen Blake, female    DOB: 1970/12/05, 45 y.o.   MRN: [id number]  Pt presents to the office today for chronic follow up. Pt states she would also like to restart phentermine today. PT states she is going to [place] in June and is trying to lose weight before her trip. Anxiety Presents for initial visit. Onset was more than 5 years ago. The problem has been waxing and waning. Symptoms include depressed mood, excessive worry and nervous/anxious behavior. Patient reports no decreased concentration, insomnia, muscle tension, palpitations or shortness of breath. Symptoms occur rarely. The severity of symptoms is moderate. The symptoms are aggravated by family issues. The quality of sleep is fair.   Her past medical history is significant for anxiety/panic attacks. There is no history of depression. Past treatments include nothing. The treatment provided significant relief. Compliance with prior treatments has been good.  Gastroesophageal Reflux She reports no choking, no coughing, no heartburn or no sore throat. This is a chronic problem. The current episode started more than 1 year ago. The problem occurs rarely. Risk factors include obesity. She has tried a PPI for the symptoms. The treatment provided moderate relief.  Hypertension This is a chronic problem. The current episode started more than 1 year ago. The problem has been resolved since onset. The problem is controlled. Associated symptoms include anxiety. Pertinent negatives include no headaches, malaise/fatigue, palpitations, peripheral edema or shortness of breath. Risk factors for coronary artery disease include dyslipidemia, obesity, post-menopausal state and sedentary lifestyle. Past treatments include beta blockers. The current treatment provides significant improvement. There is no history of kidney disease, CAD/MI, CVA, heart failure or a thyroid problem.  Seizures  This is a chronic (Pt states it has been 3  years since her last seizures) problem. The current episode started more than 1 week ago. The problem has not changed since onset.Pertinent negatives include no headaches, no sore throat and no cough.  PCOS PT currently taking metformin 1000 mg BID. PT denies any cramps or problems at this time.     Review of Systems  Constitutional: Negative.  Negative for malaise/fatigue.  HENT: Negative.  Negative for sore throat.   Eyes: Negative.   Respiratory: Negative.  Negative for cough, choking and shortness of breath.   Cardiovascular: Negative for palpitations.  Gastrointestinal: Negative.  Negative for heartburn.  Endocrine: Negative.   Genitourinary: Negative.   Musculoskeletal: Negative.   Neurological: Positive for seizures. Negative for headaches.  Hematological: Negative.   Psychiatric/Behavioral: Negative for decreased concentration. The patient is nervous/anxious. The patient does not have insomnia.   All other systems reviewed and are negative.      Objective:   Physical Exam  Constitutional: She is oriented to person, place, and time. She appears well-developed and well-nourished. No distress.  HENT:  Head: Normocephalic and atraumatic.  Right Ear: External ear normal.  Left Ear: External ear normal.  Nose: Nose normal.  Mouth/Throat: Oropharynx is clear and moist.  Eyes: Pupils are equal, round, and reactive to light.  Neck: Normal range of motion. Neck supple. No thyromegaly present.  Cardiovascular: Normal rate, regular rhythm, normal heart sounds and intact distal pulses.   No murmur heard. Pulmonary/Chest: Effort normal and breath sounds normal. No respiratory distress. She has no wheezes.  Abdominal: Soft. Bowel sounds are normal. She exhibits no distension. There is no tenderness.  Musculoskeletal: Normal range of motion. She exhibits no edema or tenderness.  Neurological: She is alert and oriented  to person, place, and time. She has normal reflexes. No cranial  nerve deficit.  Skin: Skin is warm and dry.  Psychiatric: She has a normal mood and affect. Her behavior is normal. Judgment and thought content normal.  Vitals reviewed.    BP 111/76 mmHg  Pulse 69  Temp(Src) 97.7 F (36.5 C) (Oral)  Ht 5' 8"  (1.727 m)  Wt 216 lb (97.977 kg)  BMI 32.85 kg/m2      Assessment & Plan:  1. Essential hypertension, benign - CMP14+EGFR  2. Gastroesophageal reflux disease, esophagitis presence not specified - CMP14+EGFR  3. PCOD (polycystic ovarian disease) - CMP14+EGFR  4. Nonintractable epilepsy without status epilepticus, unspecified epilepsy type ([place]) - CMP14+EGFR  5. GAD (generalized anxiety disorder) -Pt started on lexapro 10 mg today Stress management discussed RTO in 4 weeks  - CMP14+EGFR - escitalopram (LEXAPRO) 10 MG tablet; Take 1 tablet (10 mg total) by mouth daily.  Dispense: 90 tablet; Refill: 3  6. Breast cancer screening - [facility] - CMP14+EGFR  7. Weight loss counseling, encounter for -Encourage exercise and diet - phentermine 37.5 MG capsule; Take 1 capsule (37.5 mg total) by mouth every morning.  Dispense: 30 capsule; Refill: 3   Continue all meds Labs pending Health Maintenance reviewed Diet and exercise encouraged RTO 4 weeks to recheck GAD  Evelina Dun, FNP

## 2016-02-01 NOTE — Patient Instructions (Signed)
Generalized Anxiety Disorder Generalized anxiety disorder (GAD) is a mental disorder. It interferes with life functions, including relationships, work, and school. GAD is different from normal anxiety, which everyone experiences at some point in their lives in response to specific life events and activities. Normal anxiety actually helps us prepare for and get through these life events and activities. Normal anxiety goes away after the event or activity is over.  GAD causes anxiety that is not necessarily related to specific events or activities. It also causes excess anxiety in proportion to specific events or activities. The anxiety associated with GAD is also difficult to control. GAD can vary from mild to severe. People with severe GAD can have intense waves of anxiety with physical symptoms (panic attacks).  SYMPTOMS The anxiety and worry associated with GAD are difficult to control. This anxiety and worry are related to many life events and activities and also occur more days than not for 6 months or longer. People with GAD also have three or more of the following symptoms (one or more in children):  Restlessness.   Fatigue.  Difficulty concentrating.   Irritability.  Muscle tension.  Difficulty sleeping or unsatisfying sleep. DIAGNOSIS GAD is diagnosed through an assessment by your health care provider. Your health care provider will ask you questions aboutyour mood,physical symptoms, and events in your life. Your health care provider may ask you about your medical history and use of alcohol or drugs, including prescription medicines. Your health care provider may also do a physical exam and blood tests. Certain medical conditions and the use of certain substances can cause symptoms similar to those associated with GAD. Your health care provider may refer you to a mental health specialist for further evaluation. TREATMENT The following therapies are usually used to treat GAD:    Medication. Antidepressant medication usually is prescribed for long-term daily control. Antianxiety medicines may be added in severe cases, especially when panic attacks occur.   Talk therapy (psychotherapy). Certain types of talk therapy can be helpful in treating GAD by providing support, education, and guidance. A form of talk therapy called cognitive behavioral therapy can teach you healthy ways to think about and react to daily life events and activities.  Stress managementtechniques. These include yoga, meditation, and exercise and can be very helpful when they are practiced regularly. A mental health specialist can help determine which treatment is best for you. Some people see improvement with one therapy. However, other people require a combination of therapies.   This information is not intended to replace advice given to you by your health care provider. Make sure you discuss any questions you have with your health care provider.   Document Released: 12/24/2012 Document Revised: 09/19/2014 Document Reviewed: 12/24/2012 Elsevier Interactive Patient Education 2016 [employer]  

## 2016-02-02 LAB — CMP14+EGFR
ALBUMIN: 4 g/dL (ref 3.5–5.5)
ALT: 14 IU/L (ref 0–32)
AST: 18 IU/L (ref 0–40)
Albumin/Globulin Ratio: 1.4 (ref 1.2–2.2)
Alkaline Phosphatase: 42 IU/L (ref 39–117)
BUN / CREAT RATIO: 15 (ref 9–23)
BUN: 13 mg/dL (ref 6–24)
Bilirubin Total: <0.2 mg/dL (ref 0.0–1.2)
CALCIUM: 9 mg/dL (ref 8.7–10.2)
CO2: 21 mmol/L (ref 18–29)
CREATININE: 0.89 mg/dL (ref 0.57–1.00)
Chloride: 96 mmol/L (ref 96–106)
GFR, EST AFRICAN AMERICAN: 91 mL/min/{1.73_m2} (ref >59)
GFR, EST NON AFRICAN AMERICAN: 79 mL/min/{1.73_m2} (ref >59)
GLOBULIN, TOTAL: 2.9 g/dL (ref 1.5–4.5)
Glucose: 122 mg/dL — ABNORMAL HIGH (ref 65–99)
Potassium: 3.8 mmol/L (ref 3.5–5.2)
SODIUM: 137 mmol/L (ref 134–144)
Total Protein: 6.9 g/dL (ref 6.0–8.5)

## 2016-03-03 ENCOUNTER — Other Ambulatory Visit: Payer: Self-pay | Admitting: Family Medicine

## 2016-03-07 ENCOUNTER — Other Ambulatory Visit: Payer: Self-pay | Admitting: Family

## 2016-03-11 ENCOUNTER — Ambulatory Visit: Payer: BC Managed Care – PPO | Admitting: Internal Medicine

## 2016-04-12 ENCOUNTER — Other Ambulatory Visit: Payer: Self-pay | Admitting: Family Medicine

## 2016-04-26 ENCOUNTER — Other Ambulatory Visit: Payer: Self-pay | Admitting: Nurse Practitioner

## 2016-05-13 ENCOUNTER — Telehealth: Payer: Self-pay | Admitting: Internal Medicine

## 2016-05-13 MED ORDER — NEBIVOLOL HCL 10 MG PO TABS: 10.0000 mg | ORAL_TABLET | ORAL | 0 refills | Status: DC

## 2016-05-13 NOTE — Telephone Encounter (Signed)
Pt requesting a refill of her Bystolic.  Pt was changed in Jan 2017 from Labetalol to Bystolic by Dr Sherene SiresWert.  Pt advised that normally when BP meds are changed by Dr Sherene SiresWert he will send a short term supply to the pharmacy and then further refills are to be taken over by the PCP. Pt states that she was not made aware that she would need to get this from her PCP. After reviewing the chart and recent OV's I do not see this mentioned about the PCP taking over the Bystolic. Pt aware that I will send 1 month supply to her pharmacy ([facility]) and from there on her PCP will need to take over prescribing this. The patient states that she has been without this medication for a few days now and has been having headaches- pt put in a request to our office and it was "refused" - I do not see in the chart a refusal being sent to the pharmacy.  Pt aware again that this has been sent and she will be responsible for obtaining refills from PCP from here on.  Nothing further needed.

## 2016-05-31 ENCOUNTER — Other Ambulatory Visit: Payer: Self-pay | Admitting: Family Medicine

## 2016-06-09 ENCOUNTER — Other Ambulatory Visit: Payer: Self-pay | Admitting: Family

## 2016-06-09 ENCOUNTER — Other Ambulatory Visit: Payer: Self-pay | Admitting: Internal Medicine

## 2016-06-25 ENCOUNTER — Other Ambulatory Visit: Payer: Self-pay | Admitting: Family

## 2016-07-08 ENCOUNTER — Encounter: Payer: Self-pay | Admitting: Physician Assistant

## 2016-07-08 ENCOUNTER — Ambulatory Visit (INDEPENDENT_AMBULATORY_CARE_PROVIDER_SITE_OTHER): Payer: BLUE CROSS/BLUE SHIELD | Admitting: Physician Assistant

## 2016-07-08 VITALS — BP 122/85 | HR 64 | Temp 97.3°F | Ht 68.0 in | Wt 220.0 lb

## 2016-07-08 DIAGNOSIS — S060X0A Concussion without loss of consciousness, initial encounter: Secondary | ICD-10-CM

## 2016-07-08 NOTE — Patient Instructions (Signed)
Concussion, Adult  A concussion, or closed-head injury, is a brain injury caused by a direct blow to the head or by a quick and sudden movement (jolt) of the head or neck. Concussions are usually not life-threatening. Even so, the effects of a concussion can be serious. If you have had a concussion before, you are more likely to experience concussion-like symptoms after a direct blow to the head.   CAUSES  · Direct blow to the head, such as from running into another player during a soccer game, being hit in a fight, or hitting your head on a hard surface.  · A jolt of the head or neck that causes the brain to move back and forth inside the skull, such as in a car crash.  SIGNS AND SYMPTOMS  The signs of a concussion can be hard to notice. Early on, they may be missed by you, family members, and health care providers. You may look fine but act or feel differently.  Symptoms are usually temporary, but they may last for days, weeks, or even longer. Some symptoms may appear right away while others may not show up for hours or days. Every head injury is different. Symptoms include:  · Mild to moderate headaches that will not go away.  · A feeling of pressure inside your head.  · Having more trouble than usual:    Learning or remembering things you have heard.    Answering questions.    Paying attention or concentrating.    Organizing daily tasks.    Making decisions and solving problems.  · Slowness in thinking, acting or reacting, speaking, or reading.  · Getting lost or being easily confused.  · Feeling tired all the time or lacking energy (fatigued).  · Feeling drowsy.  · Sleep disturbances.    Sleeping more than usual.    Sleeping less than usual.    Trouble falling asleep.    Trouble sleeping (insomnia).  · Loss of balance or feeling lightheaded or dizzy.  · Nausea or vomiting.  · Numbness or tingling.  · Increased sensitivity to:    Sounds.    Lights.    Distractions.  · Vision problems or eyes that tire  easily.  · Diminished sense of taste or smell.  · Ringing in the ears.  · Mood changes such as feeling sad or anxious.  · Becoming easily irritated or angry for little or no reason.  · Lack of motivation.  · Seeing or hearing things other people do not see or hear (hallucinations).  DIAGNOSIS  Your health care provider can usually diagnose a concussion based on a description of your injury and symptoms. He or she will ask whether you passed out (lost consciousness) and whether you are having trouble remembering events that happened right before and during your injury.  Your evaluation might include:  · A brain scan to look for signs of injury to the brain. Even if the test shows no injury, you may still have a concussion.  · Blood tests to be sure other problems are not present.  TREATMENT  · Concussions are usually treated in an emergency department, in urgent care, or at a clinic. You may need to stay in the hospital overnight for further treatment.  · Tell your health care provider if you are taking any medicines, including prescription medicines, over-the-counter medicines, and natural remedies. Some medicines, such as blood thinners (anticoagulants) and aspirin, may increase the chance of complications. Also tell your health care   provider whether you have had alcohol or are taking illegal drugs. This information may affect treatment.  · Your health care provider will send you home with important instructions to follow.  · How fast you will recover from a concussion depends on many factors. These factors include how severe your concussion is, what part of your brain was injured, your age, and how healthy you were before the concussion.  · Most people with mild injuries recover fully. Recovery can take time. In general, recovery is slower in older persons. Also, persons who have had a concussion in the past or have other medical problems may find that it takes longer to recover from their current injury.  HOME  CARE INSTRUCTIONS  General Instructions  · Carefully follow the directions your health care provider gave you.  · Only take over-the-counter or prescription medicines for pain, discomfort, or fever as directed by your health care provider.  · Take only those medicines that your health care provider has approved.  · Do not drink alcohol until your health care provider says you are well enough to do so. Alcohol and certain other drugs may slow your recovery and can put you at risk of further injury.  · If it is harder than usual to remember things, write them down.  · If you are easily distracted, try to do one thing at a time. For example, do not try to watch TV while fixing dinner.  · Talk with family members or close friends when making important decisions.  · Keep all follow-up appointments. Repeated evaluation of your symptoms is recommended for your recovery.  · Watch your symptoms and tell others to do the same. Complications sometimes occur after a concussion. Older adults with a brain injury may have a higher risk of serious complications, such as a blood clot on the brain.  · Tell your teachers, school nurse, school counselor, coach, athletic trainer, or work manager about your injury, symptoms, and restrictions. Tell them about what you can or cannot do. They should watch for:    Increased problems with attention or concentration.    Increased difficulty remembering or learning new information.    Increased time needed to complete tasks or assignments.    Increased irritability or decreased ability to cope with stress.    Increased symptoms.  · Rest. Rest helps the brain to heal. Make sure you:    Get plenty of sleep at night. Avoid staying up late at night.    Keep the same bedtime hours on weekends and weekdays.    Rest during the day. Take daytime naps or rest breaks when you feel tired.  · Limit activities that require a lot of thought or concentration. These include:    Doing homework or job-related  work.    Watching TV.    Working on the computer.  · Avoid any situation where there is potential for another head injury (football, hockey, soccer, basketball, martial arts, downhill snow sports and horseback riding). Your condition will get worse every time you experience a concussion. You should avoid these activities until you are evaluated by the appropriate follow-up health care providers.  Returning To Your Regular Activities  You will need to return to your normal activities slowly, not all at once. You must give your body and brain enough time for recovery.  · Do not return to sports or other athletic activities until your health care provider tells you it is safe to do so.  · Ask   your health care provider when you can drive, ride a bicycle, or operate heavy machinery. Your ability to react may be slower after a brain injury. Never do these activities if you are dizzy.  · Ask your health care provider about when you can return to work or school.  Preventing Another Concussion  It is very important to avoid another brain injury, especially before you have recovered. In rare cases, another injury can lead to permanent brain damage, brain swelling, or death. The risk of this is greatest during the first 7-10 days after a head injury. Avoid injuries by:  · Wearing a seat belt when riding in a car.  · Drinking alcohol only in moderation.  · Wearing a helmet when biking, skiing, skateboarding, skating, or doing similar activities.  · Avoiding activities that could lead to a second concussion, such as contact or recreational sports, until your health care provider says it is okay.  · Taking safety measures in your home.    Remove clutter and tripping hazards from floors and stairways.    Use grab bars in bathrooms and handrails by stairs.    Place non-slip mats on floors and in bathtubs.    Improve lighting in dim areas.  SEEK MEDICAL CARE IF:  · You have increased problems paying attention or  concentrating.  · You have increased difficulty remembering or learning new information.  · You need more time to complete tasks or assignments than before.  · You have increased irritability or decreased ability to cope with stress.  · You have more symptoms than before.  Seek medical care if you have any of the following symptoms for more than 2 weeks after your injury:  · Lasting (chronic) headaches.  · Dizziness or balance problems.  · Nausea.  · Vision problems.  · Increased sensitivity to noise or light.  · Depression or mood swings.  · Anxiety or irritability.  · Memory problems.  · Difficulty concentrating or paying attention.  · Sleep problems.  · Feeling tired all the time.  SEEK IMMEDIATE MEDICAL CARE IF:  · You have severe or worsening headaches. These may be a sign of a blood clot in the brain.  · You have weakness (even if only in one hand, leg, or part of the face).  · You have numbness.  · You have decreased coordination.  · You vomit repeatedly.  · You have increased sleepiness.  · One pupil is larger than the other.  · You have convulsions.  · You have slurred speech.  · You have increased confusion. This may be a sign of a blood clot in the brain.  · You have increased restlessness, agitation, or irritability.  · You are unable to recognize people or places.  · You have neck pain.  · It is difficult to wake you up.  · You have unusual behavior changes.  · You lose consciousness.  MAKE SURE YOU:  · Understand these instructions.  · Will watch your condition.  · Will get help right away if you are not doing well or get worse.     This information is not intended to replace advice given to you by your health care provider. Make sure you discuss any questions you have with your health care provider.     Document Released: 11/19/2003 Document Revised: 09/19/2014 Document Reviewed: 03/21/2013  Elsevier Interactive Patient Education ©2016 [employer]

## 2016-07-09 NOTE — Progress Notes (Signed)
BP 122/85 (BP Location: Right Arm, Patient Position: Sitting, Cuff Size: Normal)   Pulse 64   Temp 97.3 F (36.3 C) (Oral)   Ht 5\' 8"  (1.727 m)   Wt 220 lb (99.8 kg)   BMI 33.45 kg/m    Subjective:    Patient ID: Kathleen Blake, female    DOB: Jul 07, 1971, 45 y.o.   MRN: [id number]  HPI: Kathleen Blake is a 45 y.o. female presenting on 07/08/2016 for Head Injury (hit head on car door frame yesteray. Still has some dizziness and nausea. No LOC. Feels that reaction time is a little slower today than normal. 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 The adult that was nearby heard the hit. At the time she had some visual disturbance of floaters. She did not lose consciousness. She is a known history of seizure disorders that has been well controlled. She has work today but has limited the use of screen because it was increasing her headache. She has felt nauseous. She has not eaten or drank very well today. Strongly encourage keeping her fluids up at minimum so that she doesn't get dehydrated and have increased headache from that.  Relevant past medical, surgical, family and social history reviewed and updated as indicated. Allergies and medications reviewed and updated.  Past Medical History:  Diagnosis Date  . Anxiety    no meds while preg.  Marland Kitchen. Epilepsy (HCC)   . Hypertension   . Polycystic disease, ovaries   . Seizures [facility](HCC)    March 2013    Past Surgical History:  Procedure Laterality Date  . CESAREAN SECTION     2008, 2010, 2013  . CESAREAN SECTION  08/15/2012   Procedure: CESAREAN SECTION;  Surgeon: Leslie AndreaJames E Tomblin II, MD;  Location: [facility];  Service: Obstetrics;  Laterality: N/A;  . TUBAL LIGATION  08/15/2012   Procedure: BILATERAL TUBAL LIGATION;  Surgeon: Leslie AndreaJames E Tomblin II, MD;  Location: [facility];  Service: Obstetrics;  Laterality: Bilateral;    Review of Systems    Constitutional: Positive for fatigue. Negative for activity change and fever.  Eyes: Negative.   Respiratory: Negative.  Negative for cough.   Cardiovascular: Negative.  Negative for chest pain.  Gastrointestinal: Negative.  Negative for abdominal pain.  Endocrine: Negative.   Genitourinary: Negative.  Negative for dysuria.  Musculoskeletal: Negative.   Skin: Negative.   Neurological: Positive for dizziness, light-headedness and headaches. Negative for tremors, seizures, syncope, facial asymmetry, speech difficulty, weakness and numbness.      Medication List       Accurate as of 07/08/16 11:59 PM. Always use your most recent med list.          BYSTOLIC 10 MG tablet Generic drug:  nebivolol TAKE 1 TABLET (10 MG TOTAL) BY MOUTH EVERY MORNING. FURTHER REFILLS TO COME FROM PCP.   carbamazepine 200 MG tablet Commonly known as:  TEGRETOL TAKE 1 TABLET (200 MG TOTAL) BY MOUTH AT BEDTIME.   escitalopram 10 MG tablet Commonly known as:  LEXAPRO Take 1 tablet (10 mg total) by mouth daily.   famotidine 20 MG tablet Commonly known as:  PEPCID TAKE 1 TABLET AT BEDTIME   hydrochlorothiazide 12.5 MG capsule Commonly known as:  MICROZIDE TAKE ONE CAPSULE BY MOUTH EVERY DAY   metFORMIN 1000 MG tablet Commonly known as:  GLUCOPHAGE TAKE 1 TABLET (  1,000 MG TOTAL) BY MOUTH 2 (TWO) TIMES DAILY WITH A MEAL.   pantoprazole 40 MG tablet Commonly known as:  PROTONIX Take 1 tablet (40 mg total) by mouth daily. Take 30-60 min before first meal of the day   phentermine 37.5 MG capsule Take 1 capsule (37.5 mg total) by mouth every morning.          Objective:    BP 122/85 (BP Location: Right Arm, Patient Position: Sitting, Cuff Size: Normal)   Pulse 64   Temp 97.3 F (36.3 C) (Oral)   Ht 5\' 8"  (1.727 m)   Wt 220 lb (99.8 kg)   BMI 33.45 kg/m   No Known Allergies  Physical Exam  Constitutional: She is oriented to person, place, and time. She appears well-developed and  well-nourished.  HENT:  Head: Normocephalic and atraumatic.  Eyes: Conjunctivae and EOM are normal. Pupils are equal, round, and reactive to light.  Neck: Normal range of motion. Neck supple.  Cardiovascular: Normal rate, regular rhythm, normal heart sounds and intact distal pulses.   Pulmonary/Chest: Effort normal and breath sounds normal.  Abdominal: Soft. Bowel sounds are normal.  Neurological: She is alert and oriented to person, place, and time. She has normal strength and normal reflexes. She displays no atrophy and no tremor. No cranial nerve deficit or sensory deficit. She exhibits normal muscle tone. She displays no seizure activity.  Positive Romberg when eyes were closed. An positive increase of symptoms when changing positions.  Skin: Skin is warm and dry. No rash noted.  Psychiatric: She has a normal mood and affect. Her behavior is normal. Judgment and thought content normal.       Assessment & Plan:   1. Concussion without loss of consciousness, initial encounter Concussion protocols discussed with patient. She will have ability to rest over the weekend. 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 Strongly encourage no use of screen as much as possible. She does that she must be symptom free for 7 days to know she can resume normal activities. She is also given instructions that if worsening of symptoms change in vision nausea vomiting or any other neurologic symptoms she is to report to the emergency room.  Continue all other maintenance medications as listed above.  Follow up plan: Return if symptoms worsen or fail to improve.  Educational handout given for concussion  Remus LofflerAngel S. Jackquline Branca PA-C [facility] [address]  [place], [place] [address] [phone]   07/09/2016, 11:02 AM

## 2016-07-15 ENCOUNTER — Other Ambulatory Visit: Payer: Self-pay | Admitting: Family

## 2016-07-27 ENCOUNTER — Encounter: Payer: Self-pay | Admitting: Physician Assistant

## 2016-07-27 ENCOUNTER — Ambulatory Visit (INDEPENDENT_AMBULATORY_CARE_PROVIDER_SITE_OTHER): Payer: BLUE CROSS/BLUE SHIELD | Admitting: Physician Assistant

## 2016-07-27 VITALS — BP 127/88 | HR 56 | Temp 98.6°F | Ht 68.0 in | Wt 216.6 lb

## 2016-07-27 DIAGNOSIS — J029 Acute pharyngitis, unspecified: Secondary | ICD-10-CM | POA: Diagnosis not present

## 2016-07-27 DIAGNOSIS — J0100 Acute maxillary sinusitis, unspecified: Secondary | ICD-10-CM | POA: Diagnosis not present

## 2016-07-27 MED ORDER — ALBUTEROL SULFATE HFA 108 (90 BASE) MCG/ACT IN AERS: 2.0000 | INHALATION_SPRAY | Freq: Four times a day (QID) | RESPIRATORY_TRACT | 1 refills | Status: DC | PRN

## 2016-07-27 MED ORDER — AMOXICILLIN 500 MG PO CAPS: 1000.0000 mg | ORAL_CAPSULE | Freq: Two times a day (BID) | ORAL | 1 refills | Status: DC

## 2016-07-27 NOTE — Patient Instructions (Signed)

## 2016-07-27 NOTE — Progress Notes (Signed)
BP 127/88   Pulse (!) 56   Temp 98.6 F (37 C) (Oral)   Ht 5\' 8"  (1.727 m)   Wt 216 lb 9.6 oz (98.2 kg)   BMI 32.93 kg/m    Subjective:    Patient ID: Kathleen Blake, female    DOB: 01-07-1971, 45 y.o.   MRN: [id number]  HPI: Kathleen Blake is a 45 y.o. female presenting on 07/27/2016 for Cough; Nausea; and Headache  Patient has been sick for about a week now. She had severe congestion and postnasal drainage. She has had a sore throat throughout this time. She denies any fever or chills. 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 States she has had some nausea but no vomiting. There is significant sinus pressure. She also has had a little bit more wheezing and needs a refill on her albuterol.  Relevant past medical, surgical, family and social history reviewed and updated as indicated. Allergies and medications reviewed and updated.  Past Medical History:  Diagnosis Date  . Anxiety    no meds while preg.  Marland Kitchen. Epilepsy (HCC)   . Hypertension   . Polycystic disease, ovaries   . Seizures [facility](HCC)    March 2013    Past Surgical History:  Procedure Laterality Date  . CESAREAN SECTION     2008, 2010, 2013  . CESAREAN SECTION  08/15/2012   Procedure: CESAREAN SECTION;  Surgeon: Leslie AndreaJames E Tomblin II, MD;  Location: [facility];  Service: Obstetrics;  Laterality: N/A;  . TUBAL LIGATION  08/15/2012   Procedure: BILATERAL TUBAL LIGATION;  Surgeon: Leslie AndreaJames E Tomblin II, MD;  Location: [facility];  Service: Obstetrics;  Laterality: Bilateral;    Review of Systems  Constitutional: Negative.   HENT: Positive for congestion, postnasal drip and sore throat.   Eyes: Negative.   Respiratory: Positive for cough and wheezing.   Gastrointestinal: Negative.   Genitourinary: Negative.       Medication List       Accurate as of 07/27/16 11:33 AM. Always use your most recent med list.          amoxicillin 500 MG capsule Commonly known as:  AMOXIL Take  2 capsules (1,000 mg total) by mouth 2 (two) times daily.   BYSTOLIC 10 MG tablet Generic drug:  nebivolol TAKE 1 TABLET (10 MG TOTAL) BY MOUTH EVERY MORNING. FURTHER REFILLS TO COME FROM PCP.   carbamazepine 200 MG tablet Commonly known as:  TEGRETOL TAKE 1 TABLET (200 MG TOTAL) BY MOUTH AT BEDTIME.   escitalopram 10 MG tablet Commonly known as:  LEXAPRO Take 1 tablet (10 mg total) by mouth daily.   famotidine 20 MG tablet Commonly known as:  PEPCID TAKE 1 TABLET AT BEDTIME   hydrochlorothiazide 12.5 MG capsule Commonly known as:  MICROZIDE TAKE ONE CAPSULE BY MOUTH EVERY DAY   metFORMIN 1000 MG tablet Commonly known as:  GLUCOPHAGE TAKE 1 TABLET (1,000 MG TOTAL) BY MOUTH 2 (TWO) TIMES DAILY WITH A MEAL.   pantoprazole 40 MG tablet Commonly known as:  PROTONIX Take 1 tablet (40 mg total) by mouth daily. Take 30-60 min before first meal of the day   phentermine 37.5 MG capsule Take 1 capsule (37.5 mg total) by mouth every morning.          Objective:    BP 127/88   Pulse (!) 56   Temp 98.6 F (37 C) (Oral)   Ht 5\' 8"  (1.727 m)  Wt 216 lb 9.6 oz (98.2 kg)   BMI 32.93 kg/m   No Known Allergies  Physical Exam  Constitutional: She is oriented to person, place, and time. She appears well-developed and well-nourished.  HENT:  Head: Normocephalic and atraumatic.  Right Ear: External ear normal. A middle ear effusion is present.  Left Ear: External ear normal. A middle ear effusion is present.  Nose: Mucosal edema and rhinorrhea present. Right sinus exhibits maxillary sinus tenderness. Left sinus exhibits maxillary sinus tenderness.  Mouth/Throat: Uvula is midline. Posterior oropharyngeal edema and posterior oropharyngeal erythema present. No oropharyngeal exudate.  Eyes: Conjunctivae and EOM are normal. Pupils are equal, round, and reactive to light. Right eye exhibits no discharge. Left eye exhibits no discharge.  Neck: Normal range of motion.  Cardiovascular:  Normal rate, regular rhythm and normal heart sounds.   Pulmonary/Chest: Effort normal and breath sounds normal. No respiratory distress. She has no wheezes.  Abdominal: Soft.  Lymphadenopathy:    She has no cervical adenopathy.  Neurological: She is alert and oriented to person, place, and time.  Skin: Skin is warm and dry.  Psychiatric: She has a normal mood and affect.  Nursing note and vitals reviewed.       Assessment & Plan:   1. Acute pharyngitis, unspecified etiology - amoxicillin (AMOXIL) 500 MG capsule; Take 2 capsules (1,000 mg total) by mouth 2 (two) times daily.  Dispense: 40 capsule; Refill: 1  2. Acute non-recurrent maxillary sinusitis - amoxicillin (AMOXIL) 500 MG capsule; Take 2 capsules (1,000 mg total) by mouth 2 (two) times daily.  Dispense: 40 capsule; Refill: 1 Refill albuterol MDI 2 puffs QID for cough or wheeze.  Continue all other maintenance medications as listed above.  Follow up plan: Return if symptoms worsen or fail to improve.  Educational handout given for sinusitis   Remus LofflerAngel S. Jennifier Smitherman PA-C [facility] [address]  [place], [place] [address] [phone]   07/27/2016, 11:33 AM

## 2016-08-03 ENCOUNTER — Other Ambulatory Visit: Payer: Self-pay | Admitting: Family Medicine

## 2016-08-30 ENCOUNTER — Other Ambulatory Visit: Payer: Self-pay

## 2016-08-30 DIAGNOSIS — F411 Generalized anxiety disorder: Secondary | ICD-10-CM

## 2016-08-30 MED ORDER — HYDROCHLOROTHIAZIDE 12.5 MG PO CAPS: 12.5000 mg | ORAL_CAPSULE | Freq: Every day | ORAL | 1 refills | Status: DC

## 2016-08-30 MED ORDER — FAMOTIDINE 20 MG PO TABS: 20.0000 mg | ORAL_TABLET | Freq: Every day | ORAL | 1 refills | Status: DC

## 2016-08-30 MED ORDER — ESCITALOPRAM OXALATE 10 MG PO TABS: 10.0000 mg | ORAL_TABLET | Freq: Every day | ORAL | 1 refills | Status: DC

## 2016-08-30 MED ORDER — NEBIVOLOL HCL 10 MG PO TABS: ORAL_TABLET | ORAL | 1 refills | Status: DC

## 2016-08-30 MED ORDER — CARBAMAZEPINE 200 MG PO TABS: ORAL_TABLET | ORAL | 1 refills | Status: DC

## 2016-09-20 ENCOUNTER — Other Ambulatory Visit: Payer: Self-pay | Admitting: Family Medicine

## 2016-09-20 DIAGNOSIS — J209 Acute bronchitis, unspecified: Secondary | ICD-10-CM

## 2016-09-20 DIAGNOSIS — R05 Cough: Secondary | ICD-10-CM

## 2016-09-20 DIAGNOSIS — R059 Cough, unspecified: Secondary | ICD-10-CM

## 2016-09-26 ENCOUNTER — Other Ambulatory Visit: Payer: Self-pay | Admitting: Family Medicine

## 2016-09-28 NOTE — Telephone Encounter (Signed)
Last follow up 02/01/2016.

## 2017-02-03 ENCOUNTER — Other Ambulatory Visit: Payer: Self-pay | Admitting: Physician Assistant

## 2017-02-03 DIAGNOSIS — F411 Generalized anxiety disorder: Secondary | ICD-10-CM

## 2017-02-07 NOTE — Telephone Encounter (Signed)
Patient NTBS for follow up and lab work  

## 2017-02-24 ENCOUNTER — Other Ambulatory Visit: Payer: Self-pay | Admitting: Family Medicine

## 2017-02-24 ENCOUNTER — Telehealth: Payer: Self-pay

## 2017-02-24 ENCOUNTER — Ambulatory Visit (INDEPENDENT_AMBULATORY_CARE_PROVIDER_SITE_OTHER): Payer: BLUE CROSS/BLUE SHIELD | Admitting: Family Medicine

## 2017-02-24 ENCOUNTER — Encounter: Payer: Self-pay | Admitting: Family Medicine

## 2017-02-24 ENCOUNTER — Other Ambulatory Visit: Payer: Self-pay

## 2017-02-24 VITALS — BP 90/60 | HR 58 | Temp 97.0°F | Ht 68.0 in | Wt 214.0 lb

## 2017-02-24 DIAGNOSIS — M5432 Sciatica, left side: Secondary | ICD-10-CM

## 2017-02-24 MED ORDER — PREDNISONE 10 MG PO TABS: ORAL_TABLET | ORAL | 0 refills | Status: DC

## 2017-02-24 MED ORDER — CYCLOBENZAPRINE HCL 10 MG PO TABS: 10.0000 mg | ORAL_TABLET | Freq: Three times a day (TID) | ORAL | 1 refills | Status: DC | PRN

## 2017-02-24 MED ORDER — BETAMETHASONE SOD PHOS & ACET 6 (3-3) MG/ML IJ SUSP
6.0000 mg | Freq: Once | INTRAMUSCULAR | Status: AC
  Administered 2017-02-24: 6 mg via INTRAMUSCULAR

## 2017-02-24 NOTE — Telephone Encounter (Signed)
Patient notified that rx sent to pharmacy 

## 2017-02-24 NOTE — Telephone Encounter (Signed)
Patient seen today and she thought you were going to send in a muscle relaxer as well. Please advise and route to pool B

## 2017-02-24 NOTE — Progress Notes (Signed)
Chief Complaint  Patient presents with  . Leg Pain    left  . Hip Pain    left    HPI  Patient presents today for 2 days of pain radiating from the left buttocks into the posterolateral 5 and calf on the left. It is a sharp grabbing sensation. It's worse when she walks on it. She has no known injury. She did have some low back pain last week that resolved on its own. She points to the left SI area for that. Pain rates a 7/10.  PMH: Smoking status noted ROS: Per HPI  Objective: BP 90/60   Pulse (!) 58   Temp 97 F (36.1 C) (Oral)   Ht 5\' 8"  (1.727 m)   Wt 214 lb (97.1 kg)   BMI 32.54 kg/m  Gen: NAD, alert, cooperative with exam HEENT: NCAT,  Ext: No edema, warm. Positive straight leg raise on the left at 10. Normal on right. Minimally tender at the left SI and sciatic notch region. Left lower extremity is neurovascularly intact Neuro: Alert and oriented, No gross deficits  Assessment and plan:  1. Sciatica of left side     Meds ordered this encounter  Medications  . predniSONE (DELTASONE) 10 MG tablet    Sig: Take 5 daily for 3 days followed by 4,3,2 and 1 for 3 days each.    Dispense:  45 tablet    Refill:  0  . betamethasone acetate-betamethasone sodium phosphate (CELESTONE) injection 6 mg    No orders of the defined types were placed in this encounter.   Follow up as needed.  Mechele ClaudeWarren Mazzie Brodrick, MD

## 2017-02-24 NOTE — Telephone Encounter (Signed)
I sent in the requested prescription 

## 2017-02-24 NOTE — Addendum Note (Signed)
Addended by: Cleda DaubUCKER, Jimmy Stipes G on: 02/24/2017 11:42 AM   Modules accepted: Orders

## 2017-04-02 ENCOUNTER — Other Ambulatory Visit: Payer: Self-pay | Admitting: Family

## 2017-04-02 ENCOUNTER — Emergency Department (HOSPITAL_COMMUNITY)
Admission: EM | Admit: 2017-04-02 | Discharge: 2017-04-02 | Disposition: A | Discharge status: Home / Self Care | Payer: BC Managed Care – PPO | Attending: Emergency Medicine | Admitting: Emergency Medicine

## 2017-04-02 ENCOUNTER — Encounter (HOSPITAL_COMMUNITY): Payer: Self-pay | Admitting: Emergency Medicine

## 2017-04-02 DIAGNOSIS — G44209 Tension-type headache, unspecified, not intractable: Secondary | ICD-10-CM

## 2017-04-02 DIAGNOSIS — R51 Headache: Secondary | ICD-10-CM | POA: Insufficient documentation

## 2017-04-02 DIAGNOSIS — I10 Essential (primary) hypertension: Secondary | ICD-10-CM | POA: Insufficient documentation

## 2017-04-02 DIAGNOSIS — R569 Unspecified convulsions: Secondary | ICD-10-CM | POA: Insufficient documentation

## 2017-04-02 LAB — I-STAT CHEM 8, ED
BUN: 9 mg/dL (ref 6–20)
CHLORIDE: 102 mmol/L (ref 101–111)
Calcium, Ion: 1.19 mmol/L (ref 1.15–1.40)
Creatinine, Ser: 1 mg/dL (ref 0.44–1.00)
Glucose, Bld: 126 mg/dL — ABNORMAL HIGH (ref 65–99)
HEMATOCRIT: 35 % — AB (ref 36.0–46.0)
Hemoglobin: 11.9 g/dL — ABNORMAL LOW (ref 12.0–15.0)
POTASSIUM: 3.9 mmol/L (ref 3.5–5.1)
SODIUM: 138 mmol/L (ref 135–145)
TCO2: 23 mmol/L (ref 0–100)

## 2017-04-02 MED ORDER — ACETAMINOPHEN 500 MG PO TABS
1000.0000 mg | ORAL_TABLET | Freq: Once | ORAL | Status: AC
  Administered 2017-04-02: 1000 mg via ORAL
  Filled 2017-04-02: qty 2

## 2017-04-02 MED ORDER — CARBAMAZEPINE 200 MG PO TABS: 200.0000 mg | ORAL_TABLET | Freq: Every day | ORAL | 1 refills | Status: DC

## 2017-04-02 MED ORDER — CARBAMAZEPINE 200 MG PO TABS
200.0000 mg | ORAL_TABLET | Freq: Once | ORAL | Status: AC
  Administered 2017-04-02: 200 mg via ORAL
  Filled 2017-04-02: qty 1

## 2017-04-02 NOTE — ED Triage Notes (Signed)
Patient C/o possible seizure. Per patient has hx of epilepsy and seizures. Patient states has been off prescribed medications x2 weeks. Patient states today she saw an aura, fell asleep, and woke up with some confusion and headache and feeling emotional-in which patient states "That is typically how they go." Patient alert and oriented at this time.

## 2017-04-02 NOTE — Discharge Instructions (Signed)

## 2017-04-02 NOTE — ED Provider Notes (Signed)
Emergency Department Provider Note   I have reviewed the triage vital signs and the nursing notes.   HISTORY  Chief Complaint Seizures   HPI Kathleen Blake is a 46 y.o. female with PMH of seizure disorder, HTN, and PCOS presents to the emergency pertinent for evaluation of breakthrough seizure today. The patient states that she began having one of her typical auras. She awoke sometime later with some confusion. She has had multiple seizures since the age of 46. She states that this was not new or unusual for her. She's been off of her Tegretol for the last 2 weeks because she lost her insurance. She has been following up with charity care program but as of yet has been unable to secure this medication. She has plans to follow up with him again tomorrow morning when offices open. Her only complaint at this time is a mild frontal headache. She denies any pain in her tongue. No arm or leg discomfort. No shoulder discomfort. No fevers or chills. Patient has a history of tubal ligation.    Past Medical History:  Diagnosis Date  . Anxiety    no meds while preg.  Marland Kitchen Epilepsy (HCC)   . Hypertension   . Polycystic disease, ovaries   . Seizures [facility])    March 2013    Patient Active Problem List   Diagnosis Date Noted  . Concussion with no loss of consciousness 07/08/2016  . CAP (community acquired pneumonia) 07/06/2015  . GAD (generalized anxiety disorder) 02/25/2015  . PCOD (polycystic ovarian disease) 12/10/2014  . GERD (gastroesophageal reflux disease) 06/09/2014  . Essential hypertension, benign 04/09/2013  . Epilepsy (HCC) 04/09/2013    Past Surgical History:  Procedure Laterality Date  . CESAREAN SECTION     2008, 2010, 2013  . CESAREAN SECTION  08/15/2012   Procedure: CESAREAN SECTION;  Surgeon: Leslie Andrea, MD;  Location: [facility];  Service: Obstetrics;  Laterality: N/A;  . TUBAL LIGATION  08/15/2012   Procedure: BILATERAL TUBAL LIGATION;  Surgeon: Leslie Andrea, MD;  Location: [facility];  Service: Obstetrics;  Laterality: Bilateral;    Current Outpatient Rx  . Order #: [id number] Class: Normal  . Order #: [id number] Class: Normal  . Order #: [id number] Class: Print  . Order #: [id number] Class: Normal  . Order #: [id number] Class: Normal  . Order #: [id number] Class: Normal  . Order #: [id number] Class: Normal  . Order #: [id number] Class: Normal  . Order #: [id number] Class: Print  . Order #: [id number] Class: Print  . Order #: [id number] Class: Normal  . Order #: [id number] Class: Normal    Allergies Patient has no known allergies.  Family History  Problem Relation Age of Onset  . Heart disease Mother        MI at age 31  . Cancer Mother   . Bipolar disorder Mother   . Diabetes Father   . Pancreatitis Father   . Stroke Father   . Depression Father   . Birth defects Maternal Aunt   . Heart disease Maternal Grandmother   . Heart disease Maternal Grandfather   . Diabetes Paternal Grandmother     Social History Social History  Substance Use Topics  . Smoking status: Never Smoker  . Smokeless tobacco: Never Used  . Alcohol use 0.0 oz/week     Comment: rare    Review of Systems  Constitutional: No fever/chills Eyes: No visual changes. ENT: No sore throat. Cardiovascular: Denies chest pain. Respiratory: Denies shortness of breath. Gastrointestinal: No abdominal pain.  No nausea, no vomiting.  No diarrhea.  No constipation. Genitourinary: Negative for dysuria. Musculoskeletal: Negative for back pain. Skin: Negative for rash. Neurological: Negative for headaches, focal weakness or numbness. Positive breakthrough seizure.   10-point ROS otherwise negative.  ____________________________________________   PHYSICAL EXAM:  VITAL SIGNS: ED Triage Vitals  Enc Vitals Group     BP 04/02/17 1844 127/84     Pulse Rate 04/02/17 1844 74     Resp 04/02/17 1844 16     Temp 04/02/17 1844 97.7 F (36.5 C)     Temp Source 04/02/17 1844 Oral      SpO2 04/02/17 1844 98 %     Weight 04/02/17 1844 205 lb (93 kg)     Height 04/02/17 1844 5\' 8"  (1.727 m)     Pain Score 04/02/17 1847 6   Constitutional: Alert and oriented. Well appearing and in no acute distress. Eyes: Conjunctivae are normal. PERRL. Head: Atraumatic. Nose: No congestion/rhinnorhea. Mouth/Throat: Mucous membranes are moist. Neck: No stridor.   Cardiovascular: Normal rate, regular rhythm. Good peripheral circulation. Grossly normal heart sounds.   Respiratory: Normal respiratory effort.  No retractions. Lungs CTAB. Gastrointestinal: Soft and nontender. No distention.  Musculoskeletal: No lower extremity tenderness nor edema. No gross deformities of extremities. Neurologic:  Normal speech and language. No gross focal neurologic deficits are appreciated. No pronator drift.  Skin:  Skin is warm, dry and intact. No rash noted. Psychiatric: Mood and affect are normal. Speech and behavior are normal.  ____________________________________________   LABS (all labs ordered are listed, but only abnormal results are displayed)  Labs Reviewed  I-STAT CHEM 8, ED - Abnormal; Notable for the following:       Result Value   Glucose, Bld 126 (*)    Hemoglobin 11.9 (*)    HCT 35.0 (*)    All other components within normal limits   ____________________________________________  RADIOLOGY  None ____________________________________________   PROCEDURES  Procedure(s) performed:   Procedures  None ____________________________________________   INITIAL IMPRESSION / ASSESSMENT AND PLAN / ED COURSE  Pertinent labs & imaging results that were available during my care of the patient were reviewed by me and considered in my medical decision making (see chart for details).  Patient with Kathleen Blake history of seizure disorder recently off of her medications for the past 2 weeks because of insurance issues presents to the emergency department breakthrough seizure. She is awake,  alert, with no sign of prolonged postictal period or seizure related injury. Her seizures are provoked by medication noncompliance. She is actively working with prescription assistance program to secure her seizure and other medications. She has a plan to call them tomorrow morning. No focal deficits on my neurological exam. Plan for Tylenol with mild HA. No indication for head imaging. Will send I-STAT chemistry.   07:21 PM Lab work is unremarkable. Gave single seizure medication dose here in the emergency department. Discharged with prescription for medication. Patient is following with charity care application and will call tomorrow morning to inquire further. I also gave her the contact information for the [facility] for further assistance.   At this time, I do not feel there is any life-threatening condition present. I have reviewed and discussed all results (EKG, imaging, lab, urine as appropriate), exam findings with patient. I have reviewed nursing notes and appropriate previous records.  I feel the patient is safe to be discharged home without further emergent workup. Discussed usual and customary return precautions. Patient and family (  if present) verbalize understanding and are comfortable with this plan.  Patient will follow-up with their primary care provider. If they do not have a primary care provider, information for follow-up has been provided to them. All questions have been answered.  ____________________________________________  FINAL CLINICAL IMPRESSION(S) / ED DIAGNOSES  Final diagnoses:  Seizure (HCC)  Tension headache     MEDICATIONS GIVEN DURING THIS VISIT:  Medications  carbamazepine (TEGRETOL) tablet 200 mg (200 mg Oral Given 04/02/17 1906)  acetaminophen (TYLENOL) tablet 1,000 mg (1,000 mg Oral Given 04/02/17 1906)     NEW OUTPATIENT MEDICATIONS STARTED DURING THIS VISIT:  Tegretol refill given.    Note:  This document was prepared using  Dragon voice recognition software and may include unintentional dictation errors.  Alona BeneJoshua Kaymarie Wynn, MD Emergency Medicine    Stanly Si, Arlyss RepressJoshua G, MD 04/02/17 Ernestina [id number]

## 2017-04-03 ENCOUNTER — Telehealth: Payer: Self-pay | Admitting: Family

## 2017-04-03 NOTE — Telephone Encounter (Signed)
[facility] because she could get medicine cheaper elsewhere. [facility]  [facility] and [facility] never [facility] to patient.

## 2017-04-04 ENCOUNTER — Telehealth: Payer: Self-pay | Admitting: *Deleted

## 2017-04-04 MED ORDER — HYDROCHLOROTHIAZIDE 12.5 MG PO CAPS: 12.5000 mg | ORAL_CAPSULE | Freq: Every day | ORAL | 1 refills | Status: DC

## 2017-04-04 NOTE — Telephone Encounter (Signed)
HCTZ Prescription sent to pharmacy

## 2017-04-04 NOTE — Telephone Encounter (Signed)
Patient came by checking on progress of Bystolic- Patient Assistant

## 2017-04-05 NOTE — Telephone Encounter (Signed)
Letter typed to be signed by Kathleen Blake for patient

## 2017-04-07 NOTE — Telephone Encounter (Signed)
Aware form faxed Copy at front for pt to pick up

## 2017-04-18 ENCOUNTER — Other Ambulatory Visit: Payer: Self-pay | Admitting: Family Medicine

## 2017-04-18 DIAGNOSIS — J301 Allergic rhinitis due to pollen: Secondary | ICD-10-CM

## 2017-05-18 ENCOUNTER — Telehealth: Payer: Self-pay | Admitting: Family Medicine

## 2017-05-18 MED ORDER — HYDROCHLOROTHIAZIDE 12.5 MG PO CAPS: 12.5000 mg | ORAL_CAPSULE | Freq: Every day | ORAL | 3 refills | Status: DC

## 2017-05-18 NOTE — Telephone Encounter (Signed)
Patient aware that rx was sent. Spoke with [employer] who is helping patient with her bystolic and they said they did not need anything from us that they were waiting on a letter from her pharmacy. Patient aware.

## 2017-05-18 NOTE — Telephone Encounter (Addendum)
HCTZ  Gaynelle ArabianSent  Sam Bradshaw, MD [facility] 05/18/2017, 2:27 PM

## 2017-05-19 ENCOUNTER — Telehealth: Payer: Self-pay | Admitting: Family Medicine

## 2017-05-19 NOTE — Telephone Encounter (Signed)
Apt made

## 2017-05-19 NOTE — Telephone Encounter (Signed)
Patient aware.

## 2017-05-26 ENCOUNTER — Ambulatory Visit: Payer: Self-pay | Admitting: Family

## 2017-05-29 ENCOUNTER — Encounter: Payer: Self-pay | Admitting: Family Medicine

## 2017-06-15 ENCOUNTER — Telehealth: Payer: Self-pay | Admitting: Family

## 2017-06-15 MED ORDER — NEBIVOLOL HCL 10 MG PO TABS: 10.0000 mg | ORAL_TABLET | Freq: Every morning | ORAL | 3 refills | Status: DC

## 2017-06-15 NOTE — Telephone Encounter (Signed)
Is this ok ? Please advise

## 2017-06-15 NOTE — Telephone Encounter (Signed)
RX faxed to [facility] per pt request Pt notified

## 2017-06-15 NOTE — Telephone Encounter (Signed)
Ok with refill  [name], MD [name] 06/15/2017, 11:29 AM

## 2017-06-27 ENCOUNTER — Telehealth: Payer: Self-pay | Admitting: Family Medicine

## 2017-06-27 NOTE — Telephone Encounter (Signed)
Verbal given to Patient assistance Order # [id number]

## 2017-11-16 ENCOUNTER — Encounter: Payer: Self-pay | Admitting: Pediatrics

## 2017-11-16 ENCOUNTER — Ambulatory Visit: Payer: Medicaid Other | Admitting: Pediatrics

## 2017-11-16 VITALS — BP 95/62 | HR 94 | Temp 99.1°F | Ht 68.0 in | Wt 212.0 lb

## 2017-11-16 DIAGNOSIS — I10 Essential (primary) hypertension: Secondary | ICD-10-CM

## 2017-11-16 DIAGNOSIS — R6889 Other general symptoms and signs: Secondary | ICD-10-CM

## 2017-11-16 DIAGNOSIS — R112 Nausea with vomiting, unspecified: Secondary | ICD-10-CM | POA: Diagnosis not present

## 2017-11-16 LAB — VERITOR FLU A/B WAIVED
INFLUENZA A: NEGATIVE
INFLUENZA B: NEGATIVE

## 2017-11-16 MED ORDER — ONDANSETRON 4 MG PO TBDP: 4.0000 mg | ORAL_TABLET | Freq: Two times a day (BID) | ORAL | 0 refills | Status: DC | PRN

## 2017-11-16 NOTE — Progress Notes (Signed)
  Subjective:   Patient ID: Signe Coltebekah L Pike, female    DOB: [birth year], 47 y.o.   MRN: [id number] CC: Generalized Body Aches  HPI: Signe ColtRebekah L Davis is a 47 y.o. female presenting for Generalized Body Aches  Headache and nausea bothering her the most now. Symptoms started yesterday. Last night started having diarrhea, multiple episodes of diarrhea and vomiting last night. Didn't take BP meds last night as she usually does bc feeling bad. Appetite down. No abd pain now. Some coughing, some nasal congestion. Has some coughing at baseline, more congestion than usual starting yesterday. To bed early yesterday, not taking anything at home for symptoms. Started on lamictal 3 days ago, has only taken once because then started with above symptoms. Subjective temp last night, then felt really cold, turned heat up high.   Relevant past medical, surgical, family and social history reviewed. Allergies and medications reviewed and updated. Social History   Tobacco Use  Smoking Status Never Smoker  Smokeless Tobacco Never Used   ROS: Per HPI   Objective:    BP 95/62   Pulse 94   Temp 99.1 F (37.3 C) (Oral)   Ht 5\' 8"  (1.727 m)   Wt 212 lb (96.2 kg)   BMI 32.23 kg/m   Wt Readings from Last 3 Encounters:  11/16/17 212 lb (96.2 kg)  04/02/17 205 lb (93 kg)  02/24/17 214 lb (97.1 kg)     Gen: NAD, alert, lying on exam table, cooperative with exam EYES: EOMI, no conjunctival injection, or no icterus ENT:  TMs dull gray b/l, OP without erythema LYMPH: no cervical LAD CV: NRRR, normal S1/S2, no murmur, distal pulses 2+ b/l Resp: CTABL, no wheezes, normal WOB Abd: +BS, soft, mildly tender with palpation, no rebound, guarding or peritoneal signs Ext: No edema, warm Neuro: Alert and oriented MSK: normal muscle bulk  Assessment & Plan:  Lupe CarneyRebekah was seen today for generalized body aches.  Diagnoses and all orders for this visit:  Flu-like symptoms Push fluids, discussed symptom care,  return precautions Flu neg -     Veritor Flu A/B Waived  Nausea and vomiting, intractability of vomiting not specified, unspecified vomiting type No abd pain, also with diarrhea. Suspect gastroenteritis. If not improving next couple days and for any worsening needs to be seen -     ondansetron (ZOFRAN-ODT) 4 MG disintegrating tablet; Take 1 tablet (4 mg total) by mouth 2 (two) times daily as needed for nausea or vomiting.  HTN BP low today. Hold HCTZ next few days while sick until appetite improves.    Follow up plan: As needed Rex Krasarol Jestin Burbach, MD [facility]

## 2018-03-13 DIAGNOSIS — F4310 Post-traumatic stress disorder, unspecified: Secondary | ICD-10-CM | POA: Diagnosis not present

## 2018-03-13 DIAGNOSIS — F319 Bipolar disorder, unspecified: Secondary | ICD-10-CM | POA: Diagnosis not present

## 2018-03-20 DIAGNOSIS — F4310 Post-traumatic stress disorder, unspecified: Secondary | ICD-10-CM | POA: Diagnosis not present

## 2018-03-20 DIAGNOSIS — F319 Bipolar disorder, unspecified: Secondary | ICD-10-CM | POA: Diagnosis not present

## 2018-03-27 DIAGNOSIS — F4310 Post-traumatic stress disorder, unspecified: Secondary | ICD-10-CM | POA: Diagnosis not present

## 2018-03-27 DIAGNOSIS — F319 Bipolar disorder, unspecified: Secondary | ICD-10-CM | POA: Diagnosis not present

## 2018-03-28 DIAGNOSIS — F411 Generalized anxiety disorder: Secondary | ICD-10-CM | POA: Diagnosis not present

## 2018-03-28 DIAGNOSIS — F6381 Intermittent explosive disorder: Secondary | ICD-10-CM | POA: Diagnosis not present

## 2018-03-28 DIAGNOSIS — F900 Attention-deficit hyperactivity disorder, predominantly inattentive type: Secondary | ICD-10-CM | POA: Diagnosis not present

## 2018-04-03 DIAGNOSIS — F319 Bipolar disorder, unspecified: Secondary | ICD-10-CM | POA: Diagnosis not present

## 2018-04-03 DIAGNOSIS — F4310 Post-traumatic stress disorder, unspecified: Secondary | ICD-10-CM | POA: Diagnosis not present

## 2018-04-10 DIAGNOSIS — F319 Bipolar disorder, unspecified: Secondary | ICD-10-CM | POA: Diagnosis not present

## 2018-04-10 DIAGNOSIS — F4310 Post-traumatic stress disorder, unspecified: Secondary | ICD-10-CM | POA: Diagnosis not present

## 2018-04-17 ENCOUNTER — Other Ambulatory Visit: Payer: Self-pay | Admitting: Physician Assistant

## 2018-04-17 DIAGNOSIS — J209 Acute bronchitis, unspecified: Secondary | ICD-10-CM

## 2018-04-17 DIAGNOSIS — R05 Cough: Secondary | ICD-10-CM

## 2018-04-17 DIAGNOSIS — R059 Cough, unspecified: Secondary | ICD-10-CM

## 2018-04-19 ENCOUNTER — Other Ambulatory Visit: Payer: Self-pay | Admitting: Physician Assistant

## 2018-04-19 DIAGNOSIS — J209 Acute bronchitis, unspecified: Secondary | ICD-10-CM

## 2018-04-19 DIAGNOSIS — R059 Cough, unspecified: Secondary | ICD-10-CM

## 2018-04-19 DIAGNOSIS — R05 Cough: Secondary | ICD-10-CM

## 2018-04-24 DIAGNOSIS — F4310 Post-traumatic stress disorder, unspecified: Secondary | ICD-10-CM | POA: Diagnosis not present

## 2018-04-24 DIAGNOSIS — F319 Bipolar disorder, unspecified: Secondary | ICD-10-CM | POA: Diagnosis not present

## 2018-05-01 DIAGNOSIS — F319 Bipolar disorder, unspecified: Secondary | ICD-10-CM | POA: Diagnosis not present

## 2018-05-01 DIAGNOSIS — F4310 Post-traumatic stress disorder, unspecified: Secondary | ICD-10-CM | POA: Diagnosis not present

## 2018-05-07 DIAGNOSIS — F4310 Post-traumatic stress disorder, unspecified: Secondary | ICD-10-CM | POA: Diagnosis not present

## 2018-05-07 DIAGNOSIS — F319 Bipolar disorder, unspecified: Secondary | ICD-10-CM | POA: Diagnosis not present

## 2018-05-10 DIAGNOSIS — F319 Bipolar disorder, unspecified: Secondary | ICD-10-CM | POA: Diagnosis not present

## 2018-05-10 DIAGNOSIS — F4310 Post-traumatic stress disorder, unspecified: Secondary | ICD-10-CM | POA: Diagnosis not present

## 2018-05-16 DIAGNOSIS — F4310 Post-traumatic stress disorder, unspecified: Secondary | ICD-10-CM | POA: Diagnosis not present

## 2018-05-16 DIAGNOSIS — F319 Bipolar disorder, unspecified: Secondary | ICD-10-CM | POA: Diagnosis not present

## 2018-05-24 DIAGNOSIS — F319 Bipolar disorder, unspecified: Secondary | ICD-10-CM | POA: Diagnosis not present

## 2018-05-24 DIAGNOSIS — F6381 Intermittent explosive disorder: Secondary | ICD-10-CM | POA: Diagnosis not present

## 2018-05-24 DIAGNOSIS — F411 Generalized anxiety disorder: Secondary | ICD-10-CM | POA: Diagnosis not present

## 2018-05-24 DIAGNOSIS — F900 Attention-deficit hyperactivity disorder, predominantly inattentive type: Secondary | ICD-10-CM | POA: Diagnosis not present

## 2018-05-24 DIAGNOSIS — F4310 Post-traumatic stress disorder, unspecified: Secondary | ICD-10-CM | POA: Diagnosis not present

## 2018-05-30 ENCOUNTER — Other Ambulatory Visit: Payer: Self-pay | Admitting: Family

## 2018-05-30 DIAGNOSIS — F319 Bipolar disorder, unspecified: Secondary | ICD-10-CM | POA: Diagnosis not present

## 2018-05-30 DIAGNOSIS — F4310 Post-traumatic stress disorder, unspecified: Secondary | ICD-10-CM | POA: Diagnosis not present

## 2018-06-02 ENCOUNTER — Telehealth: Payer: Self-pay | Admitting: Family Medicine

## 2018-06-06 DIAGNOSIS — F4310 Post-traumatic stress disorder, unspecified: Secondary | ICD-10-CM | POA: Diagnosis not present

## 2018-06-06 DIAGNOSIS — F319 Bipolar disorder, unspecified: Secondary | ICD-10-CM | POA: Diagnosis not present

## 2018-06-14 DIAGNOSIS — F4310 Post-traumatic stress disorder, unspecified: Secondary | ICD-10-CM | POA: Diagnosis not present

## 2018-06-14 DIAGNOSIS — F319 Bipolar disorder, unspecified: Secondary | ICD-10-CM | POA: Diagnosis not present

## 2018-06-20 DIAGNOSIS — F4310 Post-traumatic stress disorder, unspecified: Secondary | ICD-10-CM | POA: Diagnosis not present

## 2018-06-20 DIAGNOSIS — F319 Bipolar disorder, unspecified: Secondary | ICD-10-CM | POA: Diagnosis not present

## 2018-06-27 DIAGNOSIS — F4310 Post-traumatic stress disorder, unspecified: Secondary | ICD-10-CM | POA: Diagnosis not present

## 2018-06-27 DIAGNOSIS — F319 Bipolar disorder, unspecified: Secondary | ICD-10-CM | POA: Diagnosis not present

## 2018-06-27 NOTE — Telephone Encounter (Signed)
Multiple attempts made to contact patient.  This encounter will now be closed  

## 2018-07-05 DIAGNOSIS — F319 Bipolar disorder, unspecified: Secondary | ICD-10-CM | POA: Diagnosis not present

## 2018-07-05 DIAGNOSIS — F4310 Post-traumatic stress disorder, unspecified: Secondary | ICD-10-CM | POA: Diagnosis not present

## 2018-07-12 DIAGNOSIS — F319 Bipolar disorder, unspecified: Secondary | ICD-10-CM | POA: Diagnosis not present

## 2018-07-12 DIAGNOSIS — F4310 Post-traumatic stress disorder, unspecified: Secondary | ICD-10-CM | POA: Diagnosis not present

## 2018-07-18 DIAGNOSIS — F319 Bipolar disorder, unspecified: Secondary | ICD-10-CM | POA: Diagnosis not present

## 2018-07-18 DIAGNOSIS — F4310 Post-traumatic stress disorder, unspecified: Secondary | ICD-10-CM | POA: Diagnosis not present

## 2018-07-24 DIAGNOSIS — F411 Generalized anxiety disorder: Secondary | ICD-10-CM | POA: Diagnosis not present

## 2018-07-24 DIAGNOSIS — F6381 Intermittent explosive disorder: Secondary | ICD-10-CM | POA: Diagnosis not present

## 2018-07-24 DIAGNOSIS — F900 Attention-deficit hyperactivity disorder, predominantly inattentive type: Secondary | ICD-10-CM | POA: Diagnosis not present

## 2018-07-25 DIAGNOSIS — F4310 Post-traumatic stress disorder, unspecified: Secondary | ICD-10-CM | POA: Diagnosis not present

## 2018-07-25 DIAGNOSIS — F319 Bipolar disorder, unspecified: Secondary | ICD-10-CM | POA: Diagnosis not present

## 2018-08-01 DIAGNOSIS — F319 Bipolar disorder, unspecified: Secondary | ICD-10-CM | POA: Diagnosis not present

## 2018-08-01 DIAGNOSIS — F4310 Post-traumatic stress disorder, unspecified: Secondary | ICD-10-CM | POA: Diagnosis not present

## 2018-08-02 ENCOUNTER — Emergency Department (HOSPITAL_COMMUNITY)
Admission: EM | Admit: 2018-08-02 | Discharge: 2018-08-02 | Disposition: A | Discharge status: Home / Self Care | Payer: Medicaid Other | Attending: Emergency Medicine | Admitting: Emergency Medicine

## 2018-08-02 ENCOUNTER — Emergency Department (HOSPITAL_COMMUNITY): Payer: Medicaid Other

## 2018-08-02 ENCOUNTER — Other Ambulatory Visit: Payer: Self-pay

## 2018-08-02 ENCOUNTER — Encounter (HOSPITAL_COMMUNITY): Payer: Self-pay | Admitting: Emergency Medicine

## 2018-08-02 DIAGNOSIS — Z79899 Other long term (current) drug therapy: Secondary | ICD-10-CM | POA: Insufficient documentation

## 2018-08-02 DIAGNOSIS — G40909 Epilepsy, unspecified, not intractable, without status epilepticus: Secondary | ICD-10-CM | POA: Diagnosis not present

## 2018-08-02 DIAGNOSIS — I10 Essential (primary) hypertension: Secondary | ICD-10-CM | POA: Insufficient documentation

## 2018-08-02 DIAGNOSIS — R51 Headache: Secondary | ICD-10-CM | POA: Diagnosis not present

## 2018-08-02 DIAGNOSIS — N289 Disorder of kidney and ureter, unspecified: Secondary | ICD-10-CM | POA: Diagnosis not present

## 2018-08-02 DIAGNOSIS — R05 Cough: Secondary | ICD-10-CM | POA: Diagnosis not present

## 2018-08-02 LAB — BASIC METABOLIC PANEL
Anion gap: 9 (ref 5–15)
BUN: 9 mg/dL (ref 6–20)
CHLORIDE: 104 mmol/L (ref 98–111)
CO2: 23 mmol/L (ref 22–32)
Calcium: 9.1 mg/dL (ref 8.9–10.3)
Creatinine, Ser: 1.24 mg/dL — ABNORMAL HIGH (ref 0.44–1.00)
GFR calc Af Amer: 59 mL/min — ABNORMAL LOW (ref >60)
GFR calc non Af Amer: 51 mL/min — ABNORMAL LOW (ref >60)
GLUCOSE: 161 mg/dL — AB (ref 70–99)
POTASSIUM: 3.7 mmol/L (ref 3.5–5.1)
Sodium: 136 mmol/L (ref 135–145)

## 2018-08-02 LAB — CARBAMAZEPINE LEVEL, TOTAL: Carbamazepine Lvl: 6.1 ug/mL (ref 4.0–12.0)

## 2018-08-02 MED ORDER — ACETAMINOPHEN 500 MG PO TABS
1000.0000 mg | ORAL_TABLET | Freq: Once | ORAL | Status: AC
  Administered 2018-08-02: 1000 mg via ORAL
  Filled 2018-08-02: qty 2

## 2018-08-02 NOTE — ED Provider Notes (Signed)
[facility] Provider Note   CSN: [id number] Arrival date & time: 08/02/18  0840     History   Chief Complaint No chief complaint on file.  Complaint headache after seizure HPI Kathleen Blake is a 47 y.o. female.  She reports she has had a diffuse headache for a week since she had a petit mall seizure 1 week ago she also complains of a cough for several months.  She denies any fever she only gets short of breath when she coughs.  She is treated her headache with Goody powder and ibuprofen, without relief.  Headache is typical of headache she gets after seizures.  She denies noncompliance with Tegretol.  Her last Tegretol dose was 7:30 PM yesterday.  Nothing makes symptoms better or worse.  No other associated symptoms  HPI  Past Medical History:  Diagnosis Date  . Anxiety    no meds while preg.  Marland Kitchen. Epilepsy (HCC)   . Hypertension   . Polycystic disease, ovaries   . Seizures [facility](HCC)    March 2013    Patient Active Problem List   Diagnosis Date Noted  . Concussion with no loss of consciousness 07/08/2016  . CAP (community acquired pneumonia) 07/06/2015  . GAD (generalized anxiety disorder) 02/25/2015  . PCOD (polycystic ovarian disease) 12/10/2014  . GERD (gastroesophageal reflux disease) 06/09/2014  . Essential hypertension, benign 04/09/2013  . Epilepsy (HCC) 04/09/2013    Past Surgical History:  Procedure Laterality Date  . CESAREAN SECTION     2008, 2010, 2013  . CESAREAN SECTION  08/15/2012   Procedure: CESAREAN SECTION;  Surgeon: Leslie AndreaJames E Tomblin II, MD;  Location: [facility];  Service: Obstetrics;  Laterality: N/A;  . TUBAL LIGATION  08/15/2012   Procedure: BILATERAL TUBAL LIGATION;  Surgeon: Leslie AndreaJames E Tomblin II, MD;  Location: [facility];  Service: Obstetrics;  Laterality: Bilateral;     OB History    Gravida  9   Para  6   Term  5   Preterm      AB  2   Living  2     SAB  2   TAB      Ectopic      Multiple      Live Births  3            Home Medications    Prior to Admission medications   Medication Sig Start Date End Date Taking? Authorizing Provider  albuterol (PROVENTIL HFA;VENTOLIN HFA) 108 (90 Base) MCG/ACT inhaler Inhale 2 puffs into the lungs every 6 (six) hours as needed for wheezing or shortness of breath. 07/27/16   Remus LofflerJones, Angel S, PA-C  carbamazepine (TEGRETOL) 200 MG tablet Take 1 tablet (200 mg total) by mouth at bedtime. 04/02/17   Long, Arlyss RepressJoshua G, MD  cyclobenzaprine (FLEXERIL) 10 MG tablet Take 1 tablet (10 mg total) by mouth 3 (three) times daily as needed for muscle spasms. 02/24/17   Mechele ClaudeStacks, Warren, MD  escitalopram (LEXAPRO) 10 MG tablet TAKE 1 TABLET DAILY 02/07/17   Jannifer RodneyHawks, Christy A, FNP  famotidine (PEPCID) 20 MG tablet TAKE 1 TABLET AT BEDTIME 02/07/17   Hawks, Christy A, FNP  fluticasone (FLONASE) 50 MCG/ACT nasal spray PLACE 2 SPRAYS INTO BOTH NOSTRILS DAILY. 04/18/17   Elenora GammaBradshaw, Samuel L, MD  hydrochlorothiazide (MICROZIDE) 12.5 MG capsule Take 1 capsule (12.5 mg total) by mouth daily. 05/18/17   Elenora GammaBradshaw, Samuel L, MD  metFORMIN (GLUCOPHAGE) 1000 MG tablet TAKE 1 TABLET (1,000 MG TOTAL)  BY MOUTH 2 (TWO) TIMES DAILY WITH A MEAL. Patient taking differently: TAKE 1 TABLET (1,000 MG TOTAL) BY MOUTH 2 (TWO) TIMES DAILY WITH A MEAL. Pt is only taking 1 a day 07/15/16   Jannifer Rodney A, FNP  nebivolol (BYSTOLIC) 10 MG tablet Take 1 tablet (10 mg total) by mouth every morning. 06/15/17   Elenora Gamma, MD  ondansetron (ZOFRAN-ODT) 4 MG disintegrating tablet Take 1 tablet (4 mg total) by mouth 2 (two) times daily as needed for nausea or vomiting. 11/16/17   Johna Sheriff, MD  pantoprazole (PROTONIX) 40 MG tablet Take 1 tablet (40 mg total) by mouth daily. Take 30-60 min before first meal of the day 10/12/15   Nyoka Cowden, MD  PROAIR HFA 108 [phone] Base) MCG/ACT inhaler INHALE 2 PUFFS INTO THE LUNGS EVERY 6 (SIX) HOURS AS NEEDED FOR WHEEZING OR SHORTNESS OF BREATH. 09/20/16   Remus Loffler, PA-C    Family History Family History  Problem Relation Age of Onset  . Heart disease Mother        MI at age 32  . Cancer Mother   . Bipolar disorder Mother   . Diabetes Father   . Pancreatitis Father   . Stroke Father   . Depression Father   . Birth defects Maternal Aunt   . Heart disease Maternal Grandmother   . Heart disease Maternal Grandfather   . Diabetes Paternal Grandmother     Social History Social History   Tobacco Use  . Smoking status: Never Smoker  . Smokeless tobacco: Never Used  Substance Use Topics  . Alcohol use: Yes    Alcohol/week: 0.0 standard drinks    Comment: rare  . Drug use: No     Allergies   Patient has no known allergies.   Review of Systems Review of Systems  Constitutional: Negative.   HENT: Negative.   Respiratory: Positive for cough.   Cardiovascular: Negative.   Gastrointestinal: Negative.   Musculoskeletal: Negative.   Skin: Negative.   Neurological: Positive for seizures.  Psychiatric/Behavioral: Negative.   All other systems reviewed and are negative.    Physical Exam Updated Vital Signs There were no vitals taken for this visit.  Physical Exam  Constitutional: She is oriented to person, place, and time. She appears well-developed and well-nourished. No distress.  HENT:  Head: Normocephalic and atraumatic.  Eyes: Pupils are equal, round, and reactive to light. Conjunctivae and EOM are normal.  Neck: Neck supple. No tracheal deviation present. No thyromegaly present.  Cardiovascular: Normal rate and regular rhythm.  No murmur heard. Pulmonary/Chest: Effort normal and breath sounds normal.  Abdominal: Soft. Bowel sounds are normal. She exhibits no distension. There is no tenderness.  Musculoskeletal: Normal range of motion. She exhibits no edema or tenderness.  Neurological: She is alert and oriented to person, place, and time. Coordination normal.  Alert Glasgow Coma Score 15 gait normal Romberg normal  pronator drift normal finger-to-nose normal DTR symmetric bilaterally at knee jerk ankle jerk and biceps toes downward going bilaterally  Skin: Skin is warm and dry. No rash noted.  Psychiatric: She has a normal mood and affect.  Nursing note and vitals reviewed.    ED Treatments / Results  Labs (all labs ordered are listed, but only abnormal results are displayed) Labs Reviewed - No data to display  EKG None  Radiology No results found.  Procedures Procedures (including critical care time)  Medications Ordered in ED Medications - No data to display  Chest x-ray viewed by me Results for orders placed or performed during the hospital encounter of 08/02/18  Carbamazepine level, total  Result Value Ref Range   Carbamazepine Lvl 6.1 4.0 - 12.0 ug/mL  Basic metabolic panel  Result Value Ref Range   Sodium 136 135 - 145 mmol/L   Potassium 3.7 3.5 - 5.1 mmol/L   Chloride 104 98 - 111 mmol/L   CO2 23 22 - 32 mmol/L   Glucose, Bld 161 (H) 70 - 99 mg/dL   BUN 9 6 - 20 mg/dL   Creatinine, Ser 6.21 (H) 0.44 - 1.00 mg/dL   Calcium 9.1 8.9 - 30.8 mg/dL   GFR calc non Af Amer 51 (L) >60 mL/min   GFR calc Af Amer 59 (L) >60 mL/min   Anion gap 9 5 - 15   Dg Chest 2 View  Result Date: 08/02/2018 CLINICAL DATA:  Cough. EXAM: CHEST - 2 VIEW COMPARISON:  Chest x-ray dated July 30, 2015. FINDINGS: Stable cardiomediastinal silhouette. Normal pulmonary vascularity. No focal consolidation, pleural effusion, or pneumothorax. Mild elevation of the right hemidiaphragm. No acute osseous abnormality. IMPRESSION: No active cardiopulmonary disease. Electronically Signed   By: Obie Dredge M.D.   On: 08/02/2018 09:43   Ct Head Wo Contrast  Result Date: 08/02/2018 CLINICAL DATA:  Posterior headache. EXAM: CT HEAD WITHOUT CONTRAST TECHNIQUE: Contiguous axial images were obtained from the base of the skull through the vertex without intravenous contrast. COMPARISON:  Brain CT 07/06/2004 FINDINGS:  Brain: Ventricles and sulci are appropriate for patient's age. No evidence for acute cortically based infarct, intracranial hemorrhage, mass lesion or mass-effect. Vascular: No hyperdense vessel or unexpected calcification. Skull: Normal. Negative for fracture or focal lesion. Sinuses/Orbits: Paranasal sinuses are well aerated. Mastoid air cells are unremarkable. Orbits are unremarkable. Other: None. IMPRESSION: No acute intracranial process. Electronically Signed   By: Annia Belt M.D.   On: 08/02/2018 14:18   Initial Impression / Assessment and Plan / ED Course  I have reviewed the triage vital signs and the nursing notes.  Pertinent labs & imaging results that were available during my care of the patient were reviewed by me and considered in my medical decision making (see chart for details).     2:20 PM reports no relief of headache after treatment with Tylenol.  Declines further pain medicine. Lab work insistent with mild renal insufficiency.  Tegretol level is therapeutic.  Plan keep scheduled appointment with neurologist 10/07/2017 Final Clinical Impressions(s) / ED Diagnoses  Diagnosis #1 seizure disorder  #2 headache 3 renal insufficiency Final diagnoses:  None    ED Discharge Orders    None       Doug Sou, MD 08/02/18 1437

## 2018-08-02 NOTE — Discharge Instructions (Addendum)
Your Tegretol level today was in the therapeutic range at 6.1 (therapeutic range 4.0-12.0).  Your blood work shows that you have mild kidney impairment with creatinine of 1.24.  Call your primary care physician and ask him to recheck your kidney function within the next 2 or 3 weeks.  Take your Tegretol as prescribed.  Stop taking ibuprofen, or Goody powder as those medications can  interfere with kidney function.  Take Tylenol 650 mg 4 times daily as needed for headache

## 2018-08-02 NOTE — ED Notes (Signed)
Patient transported to CT 

## 2018-08-02 NOTE — ED Triage Notes (Signed)
Pt is epileptic, c/o coughing that causes "vagus nerve stimulation", had petty mal seizure last week, h/a since that seizure.  Pt unable to get an appt with her neurologist.

## 2018-08-02 NOTE — ED Notes (Signed)
Patient verbalizes understanding of discharge instructions. Opportunity for questioning and answers were provided. Ambulatory at discharge in NAD.  

## 2018-08-02 NOTE — ED Notes (Signed)
Patient transported to X-ray 

## 2018-08-06 DIAGNOSIS — F319 Bipolar disorder, unspecified: Secondary | ICD-10-CM | POA: Diagnosis not present

## 2018-08-06 DIAGNOSIS — F4310 Post-traumatic stress disorder, unspecified: Secondary | ICD-10-CM | POA: Diagnosis not present

## 2018-08-14 DIAGNOSIS — F319 Bipolar disorder, unspecified: Secondary | ICD-10-CM | POA: Diagnosis not present

## 2018-08-14 DIAGNOSIS — F4310 Post-traumatic stress disorder, unspecified: Secondary | ICD-10-CM | POA: Diagnosis not present

## 2018-08-21 ENCOUNTER — Emergency Department (HOSPITAL_COMMUNITY)
Admission: EM | Admit: 2018-08-21 | Discharge: 2018-08-21 | Discharge status: Against Medical Advice | Payer: Medicaid Other | Attending: Emergency Medicine | Admitting: Emergency Medicine

## 2018-08-21 ENCOUNTER — Encounter (HOSPITAL_COMMUNITY): Payer: Self-pay | Admitting: *Deleted

## 2018-08-21 ENCOUNTER — Emergency Department (HOSPITAL_COMMUNITY): Payer: Medicaid Other

## 2018-08-21 ENCOUNTER — Other Ambulatory Visit: Payer: Self-pay

## 2018-08-21 DIAGNOSIS — J101 Influenza due to other identified influenza virus with other respiratory manifestations: Secondary | ICD-10-CM | POA: Diagnosis not present

## 2018-08-21 DIAGNOSIS — F319 Bipolar disorder, unspecified: Secondary | ICD-10-CM | POA: Diagnosis not present

## 2018-08-21 DIAGNOSIS — F4310 Post-traumatic stress disorder, unspecified: Secondary | ICD-10-CM | POA: Diagnosis not present

## 2018-08-21 DIAGNOSIS — Z79899 Other long term (current) drug therapy: Secondary | ICD-10-CM | POA: Insufficient documentation

## 2018-08-21 DIAGNOSIS — I10 Essential (primary) hypertension: Secondary | ICD-10-CM | POA: Diagnosis not present

## 2018-08-21 DIAGNOSIS — R05 Cough: Secondary | ICD-10-CM | POA: Diagnosis not present

## 2018-08-21 DIAGNOSIS — R0682 Tachypnea, not elsewhere classified: Secondary | ICD-10-CM | POA: Diagnosis not present

## 2018-08-21 LAB — INFLUENZA PANEL BY PCR (TYPE A & B)
Influenza A By PCR: NEGATIVE
Influenza B By PCR: POSITIVE — AB

## 2018-08-21 MED ORDER — ALBUTEROL SULFATE (2.5 MG/3ML) 0.083% IN NEBU
5.0000 mg | INHALATION_SOLUTION | Freq: Once | RESPIRATORY_TRACT | Status: AC
  Administered 2018-08-21: 5 mg via RESPIRATORY_TRACT
  Filled 2018-08-21: qty 6

## 2018-08-21 NOTE — ED Provider Notes (Signed)
[facility] EMERGENCY DEPARTMENT Provider Note   CSN: [id number] Arrival date & time: 08/21/18  1200     History   Chief Complaint Chief Complaint  Patient presents with  . Cough    HPI Kathleen Blake is a 47 y.o. female.  HPI Patient presents with concern of cough, fever, chest discomfort with coughing, but otherwise no thoracic pain. Patient states that she is generally well, denies chronic medical issues. She did not receive her flu vaccine. She notes that over the past 3 days or so she has had pain with coughing, as well as worsening cough, in spite of using multiple OTC medication. It is unclear when the last temperature reading was conducted, but she notes a fever at home, possibly earlier today.  Past Medical History:  Diagnosis Date  . Anxiety    no meds while preg.  Marland Kitchen Epilepsy (HCC)   . Epilepsy (HCC)   . Hypertension   . Polycystic disease, ovaries   . Seizures [facility])    March 2013    Patient Active Problem List   Diagnosis Date Noted  . Concussion with no loss of consciousness 07/08/2016  . CAP (community acquired pneumonia) 07/06/2015  . GAD (generalized anxiety disorder) 02/25/2015  . PCOD (polycystic ovarian disease) 12/10/2014  . GERD (gastroesophageal reflux disease) 06/09/2014  . Essential hypertension, benign 04/09/2013  . Epilepsy (HCC) 04/09/2013    Past Surgical History:  Procedure Laterality Date  . CESAREAN SECTION     2008, 2010, 2013  . CESAREAN SECTION  08/15/2012   Procedure: CESAREAN SECTION;  Surgeon: Leslie Andrea, MD;  Location: [facility];  Service: Obstetrics;  Laterality: N/A;  . TUBAL LIGATION  08/15/2012   Procedure: BILATERAL TUBAL LIGATION;  Surgeon: Leslie Andrea, MD;  Location: [facility];  Service: Obstetrics;  Laterality: Bilateral;     OB History    Gravida  9   Para  6   Term  5   Preterm      AB  2   Living  2     SAB  2   TAB      Ectopic      Multiple      Live Births  3             Home Medications    Prior to Admission medications   Medication Sig Start Date End Date Taking? Authorizing Provider  albuterol (PROVENTIL HFA;VENTOLIN HFA) 108 (90 Base) MCG/ACT inhaler Inhale 2 puffs into the lungs every 6 (six) hours as needed for wheezing or shortness of breath. 07/27/16   Remus Loffler, PA-C  carbamazepine (TEGRETOL) 200 MG tablet Take 1 tablet (200 mg total) by mouth at bedtime. Patient taking differently: Take 400 mg by mouth at bedtime.  04/02/17   Long, Arlyss Repress, MD  cyclobenzaprine (FLEXERIL) 10 MG tablet Take 1 tablet (10 mg total) by mouth 3 (three) times daily as needed for muscle spasms. Patient not taking: Reported on 08/02/2018 02/24/17   Mechele Claude, MD  escitalopram (LEXAPRO) 10 MG tablet TAKE 1 TABLET DAILY Patient not taking: Reported on 08/02/2018 02/07/17   Junie Spencer, FNP  famotidine (PEPCID) 20 MG tablet TAKE 1 TABLET AT BEDTIME Patient not taking: Reported on 08/02/2018 02/07/17   Junie Spencer, FNP  fluticasone (FLONASE) 50 MCG/ACT nasal spray PLACE 2 SPRAYS INTO BOTH NOSTRILS DAILY. Patient not taking: Reported on 08/02/2018 04/18/17   Elenora Gamma, MD  hydrochlorothiazide (MICROZIDE) 12.5 MG capsule  Take 1 capsule (12.5 mg total) by mouth daily. Patient not taking: Reported on 08/02/2018 05/18/17   Elenora GammaBradshaw, Samuel L, MD  lamoTRIgine (LAMICTAL) 100 MG tablet Take 100 mg by mouth at bedtime. 07/21/18   [provider]  metFORMIN (GLUCOPHAGE) 1000 MG tablet TAKE 1 TABLET (1,000 MG TOTAL) BY MOUTH 2 (TWO) TIMES DAILY WITH A MEAL. Patient not taking: Reported on 08/02/2018 07/15/16   Jannifer RodneyHawks, Christy A, FNP  nebivolol (BYSTOLIC) 10 MG tablet Take 1 tablet (10 mg total) by mouth every morning. Patient taking differently: Take 10 mg by mouth at bedtime.  06/15/17   Elenora GammaBradshaw, Samuel L, MD  ondansetron (ZOFRAN-ODT) 4 MG disintegrating tablet Take 1 tablet (4 mg total) by mouth 2 (two) times daily as needed for nausea or  vomiting. Patient not taking: Reported on 08/02/2018 11/16/17   Johna SheriffVincent, Carol L, MD  pantoprazole (PROTONIX) 40 MG tablet Take 1 tablet (40 mg total) by mouth daily. Take 30-60 min before first meal of the day Patient not taking: Reported on 08/02/2018 10/12/15   Nyoka CowdenWert, Michael B, MD  PROAIR HFA 108 [phone](90 Base) MCG/ACT inhaler INHALE 2 PUFFS INTO THE LUNGS EVERY 6 (SIX) HOURS AS NEEDED FOR WHEEZING OR SHORTNESS OF BREATH. Patient not taking: Reported on 08/02/2018 09/20/16   Remus LofflerJones, Angel S, PA-C  sertraline (ZOLOFT) 50 MG tablet Take 50 mg by mouth at bedtime. 07/24/18   [provider]    Family History Family History  Problem Relation Age of Onset  . Heart disease Mother        MI at age 47  . Cancer Mother   . Bipolar disorder Mother   . Diabetes Father   . Pancreatitis Father   . Stroke Father   . Depression Father   . Birth defects Maternal Aunt   . Heart disease Maternal Grandmother   . Heart disease Maternal Grandfather   . Diabetes Paternal Grandmother     Social History Social History   Tobacco Use  . Smoking status: Never Smoker  . Smokeless tobacco: Never Used  Substance Use Topics  . Alcohol use: Yes    Alcohol/week: 0.0 standard drinks    Comment: rare  . Drug use: No     Allergies   Patient has no known allergies.   Review of Systems Review of Systems  Constitutional:       Per HPI, otherwise negative  HENT:       Per HPI, otherwise negative  Respiratory:       Per HPI, otherwise negative  Cardiovascular:       Per HPI, otherwise negative  Gastrointestinal: Negative for vomiting.  Endocrine:       Negative aside from HPI  Genitourinary:       Neg aside from HPI   Musculoskeletal:       Per HPI, otherwise negative  Skin: Negative.   Neurological: Negative for syncope.     Physical Exam Updated Vital Signs BP 116/68 (BP Location: Right Arm)   Pulse 73   Temp 99 F (37.2 C) (Oral)   Resp 18   Ht 5\' 7"  (1.702 m)   Wt 93.4 kg   LMP  08/14/2018   SpO2 97%   BMI 32.26 kg/m   Physical Exam  Constitutional: She is oriented to person, place, and time. She appears well-developed and well-nourished. No distress.  HENT:  Head: Normocephalic and atraumatic.  Eyes: Conjunctivae and EOM are normal.  Cardiovascular: Normal rate and regular rhythm.  Pulmonary/Chest: Tachypnea  noted. She has decreased breath sounds. She has wheezes.  Abdominal: She exhibits no distension.  Musculoskeletal: She exhibits no edema.  Neurological: She is alert and oriented to person, place, and time. No cranial nerve deficit.  Skin: Skin is warm and dry.  Psychiatric: She has a normal mood and affect.  Nursing note and vitals reviewed.    ED Treatments / Results  Labs (all labs ordered are listed, but only abnormal results are displayed) Labs Reviewed  INFLUENZA PANEL BY PCR (TYPE A & B) - Abnormal; Notable for the following components:      Result Value   Influenza B By PCR POSITIVE (*)    All other components within normal limits    Radiology Dg Chest 2 View  Result Date: 08/21/2018 CLINICAL DATA:  Cough and fever for 10 days EXAM: CHEST - 2 VIEW COMPARISON:  08/02/2018, 07/21/2015 FINDINGS: There is mild bilateral chronic interstitial thickening. There is no focal parenchymal opacity. There is no pleural effusion or pneumothorax. The heart and mediastinal contours are unremarkable. The osseous structures are unremarkable. IMPRESSION: No active cardiopulmonary disease. Electronically Signed   By: Elige Ko   On: 08/21/2018 12:44    Procedures Procedures (including critical care time)  Medications Ordered in ED Medications  albuterol (PROVENTIL) (2.5 MG/3ML) 0.083% nebulizer solution 5 mg (5 mg Nebulization Given 08/21/18 1352)     Initial Impression / Assessment and Plan / ED Course  I have reviewed the triage vital signs and the nursing notes.  Pertinent labs & imaging results that were available during my care of the  patient were reviewed by me and considered in my medical decision making (see chart for details).  Update: After the patient received initial albuterol, x-rays performed, labs obtained, the patient left prior to receiving her results.  She eloped However, she subsequently called and received her results that she had positive influenza test. Given the passage of at least 3 days since the onset of illness, she is not a candidate for Tamiflu. She was encouraged to follow-up with primary care, use OTC medication for symptomatic relief. Findings while she was here, otherwise reassuring, no evidence for pneumonia, no evidence for bacteremia or sepsis.  However, because she eloped,there is some limitation to her work-up.   Final Clinical Impressions(s) / ED Diagnoses   Final diagnoses:  Influenza B     Gerhard Munch, MD 08/21/18 2114

## 2018-08-21 NOTE — ED Notes (Signed)
ED Provider at bedside. 

## 2018-08-21 NOTE — ED Notes (Signed)
Patient transported to X-ray 

## 2018-08-21 NOTE — ED Triage Notes (Signed)
Pt c/o cough, congestion, fever, nausea that started a week ago,

## 2018-08-21 NOTE — ED Notes (Signed)
Pt had to go to pick up her kids.  Pt signed ama form and left with steady gait.

## 2018-08-21 NOTE — ED Notes (Signed)
EDP made aware of pt's leaving 

## 2018-08-21 NOTE — ED Notes (Signed)
Pt returned from xray

## 2018-08-22 NOTE — ED Notes (Signed)
Pt called requesting albuterol inhaler be called in to [facility] in [place].  Spoke with Dr. Particia Nearinghaviland and prescription for Albuterol inhaler 2 puffs every 4-6 hours prn wheezing was called in to pharmacy requested.

## 2018-09-21 DIAGNOSIS — F411 Generalized anxiety disorder: Secondary | ICD-10-CM | POA: Diagnosis not present

## 2018-09-21 DIAGNOSIS — F6381 Intermittent explosive disorder: Secondary | ICD-10-CM | POA: Diagnosis not present

## 2018-09-21 DIAGNOSIS — F900 Attention-deficit hyperactivity disorder, predominantly inattentive type: Secondary | ICD-10-CM | POA: Diagnosis not present

## 2018-11-20 ENCOUNTER — Other Ambulatory Visit: Payer: Self-pay

## 2018-11-27 DIAGNOSIS — F319 Bipolar disorder, unspecified: Secondary | ICD-10-CM | POA: Diagnosis not present

## 2018-11-27 DIAGNOSIS — F4310 Post-traumatic stress disorder, unspecified: Secondary | ICD-10-CM | POA: Diagnosis not present

## 2018-12-07 DIAGNOSIS — F4310 Post-traumatic stress disorder, unspecified: Secondary | ICD-10-CM | POA: Diagnosis not present

## 2018-12-07 DIAGNOSIS — F319 Bipolar disorder, unspecified: Secondary | ICD-10-CM | POA: Diagnosis not present

## 2018-12-10 ENCOUNTER — Telehealth: Payer: Self-pay | Admitting: General Practice

## 2018-12-10 NOTE — Telephone Encounter (Signed)
Please advise. Last OV 02/24/17

## 2018-12-11 ENCOUNTER — Ambulatory Visit (INDEPENDENT_AMBULATORY_CARE_PROVIDER_SITE_OTHER): Payer: Medicaid Other | Admitting: Family

## 2018-12-11 ENCOUNTER — Other Ambulatory Visit: Payer: Self-pay

## 2018-12-11 ENCOUNTER — Encounter: Payer: Self-pay | Admitting: Family

## 2018-12-11 DIAGNOSIS — R7303 Prediabetes: Secondary | ICD-10-CM | POA: Diagnosis not present

## 2018-12-11 DIAGNOSIS — J301 Allergic rhinitis due to pollen: Secondary | ICD-10-CM

## 2018-12-11 DIAGNOSIS — G40909 Epilepsy, unspecified, not intractable, without status epilepticus: Secondary | ICD-10-CM

## 2018-12-11 DIAGNOSIS — I10 Essential (primary) hypertension: Secondary | ICD-10-CM | POA: Diagnosis not present

## 2018-12-11 DIAGNOSIS — K219 Gastro-esophageal reflux disease without esophagitis: Secondary | ICD-10-CM | POA: Diagnosis not present

## 2018-12-11 DIAGNOSIS — F411 Generalized anxiety disorder: Secondary | ICD-10-CM | POA: Diagnosis not present

## 2018-12-11 DIAGNOSIS — E282 Polycystic ovarian syndrome: Secondary | ICD-10-CM

## 2018-12-11 DIAGNOSIS — F321 Major depressive disorder, single episode, moderate: Secondary | ICD-10-CM | POA: Diagnosis not present

## 2018-12-11 MED ORDER — FLUTICASONE PROPIONATE 50 MCG/ACT NA SUSP: 2.0000 | Freq: Every day | NASAL | 11 refills | Status: DC

## 2018-12-11 MED ORDER — SERTRALINE HCL 50 MG PO TABS: 50.0000 mg | ORAL_TABLET | Freq: Every day | ORAL | 1 refills | Status: DC

## 2018-12-11 MED ORDER — NEBIVOLOL HCL 10 MG PO TABS: 10.0000 mg | ORAL_TABLET | Freq: Every day | ORAL | 1 refills | Status: DC

## 2018-12-11 MED ORDER — CARBAMAZEPINE 200 MG PO TABS: 400.0000 mg | ORAL_TABLET | Freq: Every day | ORAL | 2 refills | Status: DC

## 2018-12-11 MED ORDER — ALBUTEROL SULFATE HFA 108 (90 BASE) MCG/ACT IN AERS: 2.0000 | INHALATION_SPRAY | Freq: Four times a day (QID) | RESPIRATORY_TRACT | 1 refills | Status: DC | PRN

## 2018-12-11 MED ORDER — METFORMIN HCL 1000 MG PO TABS: ORAL_TABLET | ORAL | 0 refills | Status: DC

## 2018-12-11 MED ORDER — HYDROCHLOROTHIAZIDE 12.5 MG PO CAPS: 12.5000 mg | ORAL_CAPSULE | Freq: Every day | ORAL | 3 refills | Status: DC

## 2018-12-11 MED ORDER — LAMOTRIGINE 100 MG PO TABS: 100.0000 mg | ORAL_TABLET | Freq: Every day | ORAL | 1 refills | Status: DC

## 2018-12-11 MED ORDER — NEBIVOLOL HCL 10 MG PO TABS
10.0000 mg | ORAL_TABLET | Freq: Every day | ORAL | 1 refills | Status: DC
Start: 2018-12-11 — End: 2018-12-11

## 2018-12-11 NOTE — Progress Notes (Signed)
Virtual Visit via telephone Note  I connected with [name] on 12/11/18 at 11:32AM by telephone and verified that I am speaking with the correct person using two identifiers. Kathleen Blake is currently located at home and child is currently with her during visit. The provider, Jannifer Rodney, FNP is located in their office at time of visit.  I discussed the limitations, risks, security and privacy concerns of performing an evaluation and management service by telephone and the availability of in person appointments. I also discussed with the patient that there may be a patient responsible charge related to this service. The patient expressed understanding and agreed to proceed.   History and Present Illness:  PT presents for a virtual visit for a chronic follow up. She is in the process of getting in to see a Neurologists. States it has been years since was seen by her Neurologists. She is followed by [facility] health every 2 months ADHD and GAD.   PT states she has been out of her HCTZ since 12/19.   Hypertension  This is a chronic problem. The current episode started more than 1 year ago. The problem has been waxing and waning since onset. The problem is uncontrolled. Associated symptoms include anxiety, blurred vision, headaches, malaise/fatigue and peripheral edema (has not taken any HCTZ). Risk factors for coronary artery disease include diabetes mellitus, obesity and sedentary lifestyle. Past treatments include beta blockers. The current treatment provides mild improvement. There is no history of CAD/MI, CVA or heart failure.  Gastroesophageal Reflux  She complains of belching and heartburn. She reports no coughing. This is a chronic problem. The current episode started more than 1 year ago. The problem occurs occasionally. She has tried an antacid and a histamine-2 antagonist for the symptoms. The treatment provided mild relief.  Anxiety  Presents for follow-up visit.  Symptoms include decreased concentration, excessive worry, nervous/anxious behavior and restlessness. Patient reports no irritability. Symptoms occur occasionally. The severity of symptoms is moderate.    Depression         This is a chronic problem.  The current episode started more than 1 year ago.   The onset quality is gradual.   The problem occurs intermittently.  The problem has been waxing and waning since onset.  Associated symptoms include decreased concentration, restlessness, decreased interest, headaches and sad.  Associated symptoms include no helplessness and no hopelessness.  Past treatments include SSRIs - Selective serotonin reuptake inhibitors.  Compliance with treatment is good.  Past medical history includes anxiety.   Diabetes  She presents for her follow-up diabetic visit. She has type 2 diabetes mellitus. Hypoglycemia symptoms include headaches, nervousness/anxiousness and seizures. Associated symptoms include blurred vision. There are no hypoglycemic complications. Pertinent negatives for diabetic complications include no CVA, nephropathy or peripheral neuropathy. Risk factors for coronary artery disease include dyslipidemia, diabetes mellitus, hypertension, sedentary lifestyle and post-menopausal. She is following a generally healthy diet. (Does not check BS at home )  Seizures   This is a chronic problem. Associated symptoms include headaches. Pertinent negatives include no cough.      Review of Systems  Constitutional: Positive for malaise/fatigue. Negative for irritability.  Eyes: Positive for blurred vision.  Respiratory: Negative for cough.   Gastrointestinal: Positive for heartburn.  Neurological: Positive for seizures and headaches.  Psychiatric/Behavioral: Positive for decreased concentration and depression. The patient is nervous/anxious.   All other systems reviewed and are negative.      Observations/Objective: No SOB or distress noted  Assessment and  Plan: 1. Essential hypertension, benign - hydrochlorothiazide (MICROZIDE) 12.5 MG capsule; Take 1 capsule (12.5 mg total) by mouth daily.  Dispense: 90 capsule; Refill: 3 - nebivolol (BYSTOLIC) 10 MG tablet; Take 1 tablet (10 mg total) by mouth at bedtime.  Dispense: 90 tablet; Refill: 1  2. Gastroesophageal reflux disease, esophagitis presence not specified  3. PCOD (polycystic ovarian disease) - metFORMIN (GLUCOPHAGE) 1000 MG tablet; TAKE 1 TABLET (1,000 MG TOTAL) BY MOUTH 2 (TWO) TIMES DAILY WITH A MEAL.  Dispense: 180 tablet; Refill: 0  4. Nonintractable epilepsy without status epilepticus, unspecified epilepsy type (HCC) - Ambulatory referral to Neurology - carbamazepine (TEGRETOL) 200 MG tablet; Take 2 tablets (400 mg total) by mouth at bedtime.  Dispense: 180 tablet; Refill: 2  5. GAD (generalized anxiety disorder) - sertraline (ZOLOFT) 50 MG tablet; Take 1 tablet (50 mg total) by mouth daily.  Dispense: 90 tablet; Refill: 1 - lamoTRIgine (LAMICTAL) 100 MG tablet; Take 1 tablet (100 mg total) by mouth at bedtime.  Dispense: 30 tablet; Refill: 1  6. Depression, major, single episode, moderate (HCC) - sertraline (ZOLOFT) 50 MG tablet; Take 1 tablet (50 mg total) by mouth daily.  Dispense: 90 tablet; Refill: 1 - lamoTRIgine (LAMICTAL) 100 MG tablet; Take 1 tablet (100 mg total) by mouth at bedtime.  Dispense: 30 tablet; Refill: 1  7. Prediabetes - metFORMIN (GLUCOPHAGE) 1000 MG tablet; TAKE 1 TABLET (1,000 MG TOTAL) BY MOUTH 2 (TWO) TIMES DAILY WITH A MEAL.  Dispense: 180 tablet; Refill: 0  8. Allergic rhinitis due to pollen - fluticasone (FLONASE) 50 MCG/ACT nasal spray; Place 2 sprays into both nostrils daily.  Dispense: 16 g; Refill: 11  Pt will call and make face to face visit within the next 2-3 months for pap and lab work.  She will follow up with neurologists for her seizures  Keep all appts with [facility]     I discussed the assessment and treatment plan with the  patient. The patient was provided an opportunity to ask questions and all were answered. The patient agreed with the plan and demonstrated an understanding of the instructions.   The patient was advised to call back or seek an in-person evaluation if the symptoms worsen or if the condition fails to improve as anticipated.  The above assessment and management plan was discussed with the patient. The patient verbalized understanding of and has agreed to the management plan. Patient is aware to call the clinic if symptoms persist or worsen. Patient is aware when to return to the clinic for a follow-up visit. Patient educated on when it is appropriate to go to the emergency department.    Call ended 11;58AM, I  provided 26 minutes of non-face-to-face time during this encounter.    Jannifer Rodney, FNP

## 2018-12-11 NOTE — Telephone Encounter (Signed)
It says something about patient possibly being dismissed from this department, I do not know the ins and outs of this or why but he does not want to come in then we can do a tele-visit or a video visit via Skype, I have that set up now but she needs at least something on this regards, I have never seen the patient, Neysa Bonito did see the patient once so she at least might know her but she should probably get this set up with 1 of [place].

## 2018-12-11 NOTE — Telephone Encounter (Signed)
Can we schedule patient due to the fact she is dismissed from department?

## 2018-12-14 ENCOUNTER — Other Ambulatory Visit: Payer: Self-pay | Admitting: Family

## 2018-12-14 DIAGNOSIS — G40909 Epilepsy, unspecified, not intractable, without status epilepticus: Secondary | ICD-10-CM

## 2018-12-14 DIAGNOSIS — I10 Essential (primary) hypertension: Secondary | ICD-10-CM

## 2018-12-14 MED ORDER — CARBAMAZEPINE 200 MG PO TABS: 400.0000 mg | ORAL_TABLET | Freq: Every day | ORAL | 2 refills | Status: DC

## 2018-12-14 MED ORDER — NEBIVOLOL HCL 10 MG PO TABS: 10.0000 mg | ORAL_TABLET | Freq: Every day | ORAL | 1 refills | Status: DC

## 2018-12-15 ENCOUNTER — Other Ambulatory Visit: Payer: Self-pay | Admitting: Family

## 2018-12-15 DIAGNOSIS — I10 Essential (primary) hypertension: Secondary | ICD-10-CM

## 2018-12-15 MED ORDER — NEBIVOLOL HCL 10 MG PO TABS: 10.0000 mg | ORAL_TABLET | Freq: Every day | ORAL | 1 refills | Status: DC

## 2018-12-19 ENCOUNTER — Telehealth: Payer: Self-pay

## 2018-12-19 NOTE — Telephone Encounter (Signed)
Can we call patient see what other medication she has been on and failed. She told me she has taken several.

## 2018-12-19 NOTE — Telephone Encounter (Signed)
Medicaid denied patient's Bystolic prescription.  She must try and fail two of the following: Atenolol Carvedilol Labetalol Metoprolol succinate XL Metoprolol tartrate Propranolol ER Sorine Sotalol  Patient has already tried Labetalol and failed, must try one more.

## 2018-12-19 NOTE — Telephone Encounter (Signed)
Patient has tried lebatolol and atenolol years ago. She said the bystolic is working so well and she really wants to keep getting it. Please advise

## 2018-12-21 DIAGNOSIS — F319 Bipolar disorder, unspecified: Secondary | ICD-10-CM | POA: Diagnosis not present

## 2018-12-21 DIAGNOSIS — F4310 Post-traumatic stress disorder, unspecified: Secondary | ICD-10-CM | POA: Diagnosis not present

## 2018-12-25 NOTE — Telephone Encounter (Signed)
Sent prior authorization to [employer].

## 2018-12-31 DIAGNOSIS — F319 Bipolar disorder, unspecified: Secondary | ICD-10-CM | POA: Diagnosis not present

## 2018-12-31 DIAGNOSIS — F4310 Post-traumatic stress disorder, unspecified: Secondary | ICD-10-CM | POA: Diagnosis not present

## 2019-01-08 DIAGNOSIS — F4310 Post-traumatic stress disorder, unspecified: Secondary | ICD-10-CM | POA: Diagnosis not present

## 2019-01-08 DIAGNOSIS — F319 Bipolar disorder, unspecified: Secondary | ICD-10-CM | POA: Diagnosis not present

## 2019-01-11 DIAGNOSIS — G40909 Epilepsy, unspecified, not intractable, without status epilepticus: Secondary | ICD-10-CM | POA: Diagnosis not present

## 2019-01-15 DIAGNOSIS — F319 Bipolar disorder, unspecified: Secondary | ICD-10-CM | POA: Diagnosis not present

## 2019-01-15 DIAGNOSIS — F4310 Post-traumatic stress disorder, unspecified: Secondary | ICD-10-CM | POA: Diagnosis not present

## 2019-01-21 DIAGNOSIS — F4310 Post-traumatic stress disorder, unspecified: Secondary | ICD-10-CM | POA: Diagnosis not present

## 2019-01-21 DIAGNOSIS — F319 Bipolar disorder, unspecified: Secondary | ICD-10-CM | POA: Diagnosis not present

## 2019-01-25 DIAGNOSIS — E669 Obesity, unspecified: Secondary | ICD-10-CM | POA: Diagnosis not present

## 2019-01-25 DIAGNOSIS — R41840 Attention and concentration deficit: Secondary | ICD-10-CM | POA: Diagnosis not present

## 2019-01-25 DIAGNOSIS — G40909 Epilepsy, unspecified, not intractable, without status epilepticus: Secondary | ICD-10-CM | POA: Diagnosis not present

## 2019-01-25 DIAGNOSIS — E119 Type 2 diabetes mellitus without complications: Secondary | ICD-10-CM | POA: Diagnosis not present

## 2019-01-25 DIAGNOSIS — Z87820 Personal history of traumatic brain injury: Secondary | ICD-10-CM | POA: Diagnosis not present

## 2019-01-25 DIAGNOSIS — F319 Bipolar disorder, unspecified: Secondary | ICD-10-CM | POA: Diagnosis not present

## 2019-01-26 DIAGNOSIS — R41840 Attention and concentration deficit: Secondary | ICD-10-CM | POA: Diagnosis not present

## 2019-01-26 DIAGNOSIS — G40909 Epilepsy, unspecified, not intractable, without status epilepticus: Secondary | ICD-10-CM | POA: Diagnosis not present

## 2019-01-26 DIAGNOSIS — Z87820 Personal history of traumatic brain injury: Secondary | ICD-10-CM | POA: Diagnosis not present

## 2019-01-27 DIAGNOSIS — R41840 Attention and concentration deficit: Secondary | ICD-10-CM | POA: Diagnosis not present

## 2019-01-27 DIAGNOSIS — Z87820 Personal history of traumatic brain injury: Secondary | ICD-10-CM | POA: Diagnosis not present

## 2019-01-27 DIAGNOSIS — G40909 Epilepsy, unspecified, not intractable, without status epilepticus: Secondary | ICD-10-CM | POA: Diagnosis not present

## 2019-01-28 DIAGNOSIS — Z87820 Personal history of traumatic brain injury: Secondary | ICD-10-CM | POA: Diagnosis not present

## 2019-01-28 DIAGNOSIS — R41840 Attention and concentration deficit: Secondary | ICD-10-CM | POA: Diagnosis not present

## 2019-01-28 DIAGNOSIS — G40909 Epilepsy, unspecified, not intractable, without status epilepticus: Secondary | ICD-10-CM | POA: Diagnosis not present

## 2019-01-29 DIAGNOSIS — F4310 Post-traumatic stress disorder, unspecified: Secondary | ICD-10-CM | POA: Diagnosis not present

## 2019-01-29 DIAGNOSIS — Z87820 Personal history of traumatic brain injury: Secondary | ICD-10-CM | POA: Diagnosis not present

## 2019-01-29 DIAGNOSIS — G40909 Epilepsy, unspecified, not intractable, without status epilepticus: Secondary | ICD-10-CM | POA: Diagnosis not present

## 2019-01-29 DIAGNOSIS — F319 Bipolar disorder, unspecified: Secondary | ICD-10-CM | POA: Diagnosis not present

## 2019-01-29 DIAGNOSIS — R41840 Attention and concentration deficit: Secondary | ICD-10-CM | POA: Diagnosis not present

## 2019-01-30 DIAGNOSIS — Z87820 Personal history of traumatic brain injury: Secondary | ICD-10-CM | POA: Diagnosis not present

## 2019-01-30 DIAGNOSIS — G40909 Epilepsy, unspecified, not intractable, without status epilepticus: Secondary | ICD-10-CM | POA: Diagnosis not present

## 2019-01-30 DIAGNOSIS — R41840 Attention and concentration deficit: Secondary | ICD-10-CM | POA: Diagnosis not present

## 2019-01-30 MED ORDER — CETIRIZINE HCL 10 MG PO TABS
10.00 | ORAL_TABLET | ORAL | Status: DC
Start: ? — End: 2019-01-30

## 2019-01-30 MED ORDER — LAMOTRIGINE 100 MG PO TABS
100.00 | ORAL_TABLET | ORAL | Status: DC
Start: 2019-01-30 — End: 2019-01-30

## 2019-01-30 MED ORDER — ACETAMINOPHEN 325 MG PO TABS
650.00 | ORAL_TABLET | ORAL | Status: DC
Start: ? — End: 2019-01-30

## 2019-01-30 MED ORDER — SODIUM CHLORIDE FLUSH 0.9 % IV SOLN
10.00 | INTRAVENOUS | Status: DC
Start: 2019-01-30 — End: 2019-01-30

## 2019-01-30 MED ORDER — Medication
450.00 | Status: DC
Start: 2019-01-30 — End: 2019-01-30

## 2019-01-30 MED ORDER — SODIUM CHLORIDE FLUSH 0.9 % IV SOLN
10.00 | INTRAVENOUS | Status: DC
Start: ? — End: 2019-01-30

## 2019-01-30 MED ORDER — METFORMIN HCL ER 500 MG PO TB24
500.00 | ORAL_TABLET | ORAL | Status: DC
Start: 2019-01-30 — End: 2019-01-30

## 2019-01-30 MED ORDER — SERTRALINE HCL 50 MG PO TABS
50.00 | ORAL_TABLET | ORAL | Status: DC
Start: 2019-01-30 — End: 2019-01-30

## 2019-01-30 MED ORDER — ONDANSETRON HCL 4 MG/2ML IJ SOLN
4.00 | INTRAMUSCULAR | Status: DC
Start: ? — End: 2019-01-30

## 2019-01-30 MED ORDER — METOPROLOL SUCCINATE ER 50 MG PO TB24
100.00 | ORAL_TABLET | ORAL | Status: DC
Start: 2019-01-30 — End: 2019-01-30

## 2019-01-30 MED ORDER — HYDROCHLOROTHIAZIDE 12.5 MG PO CAPS
12.50 | ORAL_CAPSULE | ORAL | Status: DC
Start: 2019-01-31 — End: 2019-01-30

## 2019-01-30 MED ORDER — LORAZEPAM 2 MG/ML IJ SOLN
2.00 | INTRAMUSCULAR | Status: DC
Start: ? — End: 2019-01-30

## 2019-02-04 DIAGNOSIS — F319 Bipolar disorder, unspecified: Secondary | ICD-10-CM | POA: Diagnosis not present

## 2019-02-04 DIAGNOSIS — F4310 Post-traumatic stress disorder, unspecified: Secondary | ICD-10-CM | POA: Diagnosis not present

## 2019-02-12 DIAGNOSIS — F319 Bipolar disorder, unspecified: Secondary | ICD-10-CM | POA: Diagnosis not present

## 2019-02-12 DIAGNOSIS — F4310 Post-traumatic stress disorder, unspecified: Secondary | ICD-10-CM | POA: Diagnosis not present

## 2019-02-19 DIAGNOSIS — F319 Bipolar disorder, unspecified: Secondary | ICD-10-CM | POA: Diagnosis not present

## 2019-02-19 DIAGNOSIS — F4310 Post-traumatic stress disorder, unspecified: Secondary | ICD-10-CM | POA: Diagnosis not present

## 2019-02-26 DIAGNOSIS — F4310 Post-traumatic stress disorder, unspecified: Secondary | ICD-10-CM | POA: Diagnosis not present

## 2019-02-26 DIAGNOSIS — F319 Bipolar disorder, unspecified: Secondary | ICD-10-CM | POA: Diagnosis not present

## 2019-03-04 ENCOUNTER — Encounter: Payer: Self-pay | Admitting: Family Medicine

## 2019-03-04 ENCOUNTER — Other Ambulatory Visit: Payer: Self-pay

## 2019-03-04 ENCOUNTER — Ambulatory Visit (INDEPENDENT_AMBULATORY_CARE_PROVIDER_SITE_OTHER): Payer: Medicaid Other | Admitting: Family Medicine

## 2019-03-04 DIAGNOSIS — I10 Essential (primary) hypertension: Secondary | ICD-10-CM | POA: Diagnosis not present

## 2019-03-04 DIAGNOSIS — R7303 Prediabetes: Secondary | ICD-10-CM | POA: Diagnosis not present

## 2019-03-04 DIAGNOSIS — F321 Major depressive disorder, single episode, moderate: Secondary | ICD-10-CM

## 2019-03-04 DIAGNOSIS — F411 Generalized anxiety disorder: Secondary | ICD-10-CM | POA: Diagnosis not present

## 2019-03-04 DIAGNOSIS — J301 Allergic rhinitis due to pollen: Secondary | ICD-10-CM

## 2019-03-04 DIAGNOSIS — E282 Polycystic ovarian syndrome: Secondary | ICD-10-CM | POA: Diagnosis not present

## 2019-03-04 MED ORDER — METFORMIN HCL 1000 MG PO TABS: ORAL_TABLET | ORAL | 0 refills | Status: DC

## 2019-03-04 MED ORDER — ALBUTEROL SULFATE HFA 108 (90 BASE) MCG/ACT IN AERS: 2.0000 | INHALATION_SPRAY | Freq: Four times a day (QID) | RESPIRATORY_TRACT | 2 refills | Status: DC | PRN

## 2019-03-04 MED ORDER — METOPROLOL SUCCINATE ER 25 MG PO TB24: 25.0000 mg | ORAL_TABLET | Freq: Every day | ORAL | 0 refills | Status: DC

## 2019-03-04 MED ORDER — HYDROCHLOROTHIAZIDE 12.5 MG PO CAPS: 12.5000 mg | ORAL_CAPSULE | Freq: Every day | ORAL | 0 refills | Status: DC

## 2019-03-04 MED ORDER — FLUTICASONE PROPIONATE 50 MCG/ACT NA SUSP: 2.0000 | Freq: Every day | NASAL | 11 refills | Status: DC

## 2019-03-04 MED ORDER — SERTRALINE HCL 50 MG PO TABS: 50.0000 mg | ORAL_TABLET | Freq: Every day | ORAL | 0 refills | Status: DC

## 2019-03-04 MED ORDER — HYDROXYZINE HCL 25 MG PO TABS: 25.0000 mg | ORAL_TABLET | Freq: Three times a day (TID) | ORAL | 1 refills | Status: AC | PRN

## 2019-03-04 MED ORDER — LAMOTRIGINE 100 MG PO TABS: 100.0000 mg | ORAL_TABLET | Freq: Every day | ORAL | 0 refills | Status: DC

## 2019-03-04 NOTE — Progress Notes (Signed)
Virtual Visit via telephone Note  I connected with Kathleen Blake on 03/04/19 at 1550 by telephone and verified that I am speaking with the correct person using two identifiers. Kathleen Blake is currently located at home and no other people are currently with her during visit. The provider, Fransisca Kaufmann Arrington Yohe, MD is located in their office at time of visit.  Call ended at 1608  I discussed the limitations, risks, security and privacy concerns of performing an evaluation and management service by telephone and the availability of in person appointments. I also discussed with the patient that there may be a patient responsible charge related to this service. The patient expressed understanding and agreed to proceed.   History and Present Illness: Hypertension Patient is currently on hydrochlorothiazide and Bystolic but has been out of bystolic, and their blood pressure today is unknown. Patient denies any lightheadedness or dizziness. Patient denies blurred vision, chest pains, shortness of breath, or weakness. Denies any side effects from medication and is content with current medication. Patient gets headaches because off of bystolic. Price and coverage  Anxiety and bipolar Patient has been doing a lot better on the hydroxyzine and sertraline and lamictal.  She denies any thoughts of suicide or hurting herself.   Prediabetes Patient comes in today for recheck of his diabetes. Patient has been currently taking metformin. Patient is not currently on an ACE inhibitor/ARB. Patient has not seen an ophthalmologist this year. Patient denies any issues with their feet.   No diagnosis found.  Outpatient Encounter Medications as of 03/04/2019  Medication Sig  . albuterol (PROVENTIL HFA;VENTOLIN HFA) 108 (90 Base) MCG/ACT inhaler Inhale 2 puffs into the lungs every 6 (six) hours as needed for wheezing or shortness of breath.  . carbamazepine (TEGRETOL) 200 MG tablet Take 2 tablets (400 mg  total) by mouth at bedtime.  . fluticasone (FLONASE) 50 MCG/ACT nasal spray Place 2 sprays into both nostrils daily.  . hydrochlorothiazide (MICROZIDE) 12.5 MG capsule Take 1 capsule (12.5 mg total) by mouth daily.  . hydrOXYzine (ATARAX/VISTARIL) 25 MG tablet Take 25 mg by mouth 3 (three) times daily as needed.  . lamoTRIgine (LAMICTAL) 100 MG tablet Take 1 tablet (100 mg total) by mouth at bedtime.  . metFORMIN (GLUCOPHAGE) 1000 MG tablet TAKE 1 TABLET (1,000 MG TOTAL) BY MOUTH 2 (TWO) TIMES DAILY WITH A MEAL.  Marland Kitchen nebivolol (BYSTOLIC) 10 MG tablet Take 1 tablet (10 mg total) by mouth at bedtime.  . sertraline (ZOLOFT) 50 MG tablet Take 1 tablet (50 mg total) by mouth daily.   No facility-administered encounter medications on file as of 03/04/2019.     Review of Systems  Constitutional: Negative for chills and fever.  Eyes: Negative for visual disturbance.  Respiratory: Negative for chest tightness and shortness of breath.   Cardiovascular: Negative for chest pain and leg swelling.  Musculoskeletal: Negative for back pain and gait problem.  Skin: Negative for rash.  Neurological: Positive for headaches. Negative for dizziness and light-headedness.  Psychiatric/Behavioral: Positive for dysphoric mood. Negative for agitation, behavioral problems, self-injury, sleep disturbance and suicidal ideas. The patient is nervous/anxious.   All other systems reviewed and are negative.   Observations/Objective:   Assessment and Plan: Problem List Items Addressed This Visit      Cardiovascular and Mediastinum   Essential hypertension, benign - Primary   Relevant Medications   hydrochlorothiazide (MICROZIDE) 12.5 MG capsule   metoprolol succinate (TOPROL-XL) 25 MG 24 hr tablet     Endocrine  PCOD (polycystic ovarian disease)   Relevant Medications   metFORMIN (GLUCOPHAGE) 1000 MG tablet     Other   GAD (generalized anxiety disorder)   Relevant Medications   lamoTRIgine (LAMICTAL) 100 MG  tablet   hydrOXYzine (ATARAX/VISTARIL) 25 MG tablet   sertraline (ZOLOFT) 50 MG tablet   Depression, major, single episode, moderate (HCC)   Relevant Medications   lamoTRIgine (LAMICTAL) 100 MG tablet   hydrOXYzine (ATARAX/VISTARIL) 25 MG tablet   sertraline (ZOLOFT) 50 MG tablet    Other Visit Diagnoses    Allergic rhinitis due to pollen, unspecified seasonality       Relevant Medications   fluticasone (FLONASE) 50 MCG/ACT nasal spray   Prediabetes       Relevant Medications   metFORMIN (GLUCOPHAGE) 1000 MG tablet      Will continue medication including hydrochlorothiazide and metformin and Lamictal and hydroxyzine and sertraline because patient says she is doing very well on those.  We will do refill of Flonase, she has chronic sinus and cough does not sound like anything acute.  Patient could not get Bystolic because of cost and will switch to metoprolol which she has taken previously and she will come in in 4 weeks for blood pressure recheck Follow Up Instructions: 4-week blood pressure recheck  Patient has an allergic cough that is going on for years and it has gotten better   I discussed the assessment and treatment plan with the patient. The patient was provided an opportunity to ask questions and all were answered. The patient agreed with the plan and demonstrated an understanding of the instructions.   The patient was advised to call back or seek an in-person evaluation if the symptoms worsen or if the condition fails to improve as anticipated.  The above assessment and management plan was discussed with the patient. The patient verbalized understanding of and has agreed to the management plan. Patient is aware to call the clinic if symptoms persist or worsen. Patient is aware when to return to the clinic for a follow-up visit. Patient educated on when it is appropriate to go to the emergency department.    I provided 18 minutes of non-face-to-face time during this  encounter.    Nils PyleJoshua A Glendon Dunwoody, MD

## 2019-03-05 DIAGNOSIS — F4310 Post-traumatic stress disorder, unspecified: Secondary | ICD-10-CM | POA: Diagnosis not present

## 2019-03-05 DIAGNOSIS — F319 Bipolar disorder, unspecified: Secondary | ICD-10-CM | POA: Diagnosis not present

## 2019-03-12 DIAGNOSIS — F4310 Post-traumatic stress disorder, unspecified: Secondary | ICD-10-CM | POA: Diagnosis not present

## 2019-03-12 DIAGNOSIS — F319 Bipolar disorder, unspecified: Secondary | ICD-10-CM | POA: Diagnosis not present

## 2019-03-18 DIAGNOSIS — R41840 Attention and concentration deficit: Secondary | ICD-10-CM | POA: Diagnosis not present

## 2019-03-18 DIAGNOSIS — R51 Headache: Secondary | ICD-10-CM | POA: Diagnosis not present

## 2019-03-18 DIAGNOSIS — R6889 Other general symptoms and signs: Secondary | ICD-10-CM | POA: Diagnosis not present

## 2019-03-19 DIAGNOSIS — R6889 Other general symptoms and signs: Secondary | ICD-10-CM | POA: Diagnosis not present

## 2019-03-19 DIAGNOSIS — F4310 Post-traumatic stress disorder, unspecified: Secondary | ICD-10-CM | POA: Diagnosis not present

## 2019-03-19 DIAGNOSIS — F319 Bipolar disorder, unspecified: Secondary | ICD-10-CM | POA: Diagnosis not present

## 2019-03-25 DIAGNOSIS — F4310 Post-traumatic stress disorder, unspecified: Secondary | ICD-10-CM | POA: Diagnosis not present

## 2019-03-25 DIAGNOSIS — F319 Bipolar disorder, unspecified: Secondary | ICD-10-CM | POA: Diagnosis not present

## 2019-04-02 DIAGNOSIS — F319 Bipolar disorder, unspecified: Secondary | ICD-10-CM | POA: Diagnosis not present

## 2019-04-02 DIAGNOSIS — F4310 Post-traumatic stress disorder, unspecified: Secondary | ICD-10-CM | POA: Diagnosis not present

## 2019-04-08 DIAGNOSIS — R51 Headache: Secondary | ICD-10-CM | POA: Diagnosis not present

## 2019-04-08 DIAGNOSIS — F410 Panic disorder [episodic paroxysmal anxiety] without agoraphobia: Secondary | ICD-10-CM | POA: Diagnosis not present

## 2019-04-08 DIAGNOSIS — F319 Bipolar disorder, unspecified: Secondary | ICD-10-CM | POA: Diagnosis not present

## 2019-04-08 DIAGNOSIS — R6889 Other general symptoms and signs: Secondary | ICD-10-CM | POA: Diagnosis not present

## 2019-04-08 DIAGNOSIS — F4310 Post-traumatic stress disorder, unspecified: Secondary | ICD-10-CM | POA: Diagnosis not present

## 2019-04-16 DIAGNOSIS — F4310 Post-traumatic stress disorder, unspecified: Secondary | ICD-10-CM | POA: Diagnosis not present

## 2019-04-16 DIAGNOSIS — F319 Bipolar disorder, unspecified: Secondary | ICD-10-CM | POA: Diagnosis not present

## 2019-04-22 DIAGNOSIS — F319 Bipolar disorder, unspecified: Secondary | ICD-10-CM | POA: Diagnosis not present

## 2019-04-22 DIAGNOSIS — F4310 Post-traumatic stress disorder, unspecified: Secondary | ICD-10-CM | POA: Diagnosis not present

## 2019-04-30 DIAGNOSIS — F4310 Post-traumatic stress disorder, unspecified: Secondary | ICD-10-CM | POA: Diagnosis not present

## 2019-04-30 DIAGNOSIS — F319 Bipolar disorder, unspecified: Secondary | ICD-10-CM | POA: Diagnosis not present

## 2019-05-07 DIAGNOSIS — F4310 Post-traumatic stress disorder, unspecified: Secondary | ICD-10-CM | POA: Diagnosis not present

## 2019-05-07 DIAGNOSIS — F319 Bipolar disorder, unspecified: Secondary | ICD-10-CM | POA: Diagnosis not present

## 2019-05-14 DIAGNOSIS — F4310 Post-traumatic stress disorder, unspecified: Secondary | ICD-10-CM | POA: Diagnosis not present

## 2019-05-14 DIAGNOSIS — F319 Bipolar disorder, unspecified: Secondary | ICD-10-CM | POA: Diagnosis not present

## 2019-05-21 DIAGNOSIS — F319 Bipolar disorder, unspecified: Secondary | ICD-10-CM | POA: Diagnosis not present

## 2019-05-21 DIAGNOSIS — F4310 Post-traumatic stress disorder, unspecified: Secondary | ICD-10-CM | POA: Diagnosis not present

## 2019-05-28 DIAGNOSIS — F4310 Post-traumatic stress disorder, unspecified: Secondary | ICD-10-CM | POA: Diagnosis not present

## 2019-05-28 DIAGNOSIS — F319 Bipolar disorder, unspecified: Secondary | ICD-10-CM | POA: Diagnosis not present

## 2019-06-04 DIAGNOSIS — F319 Bipolar disorder, unspecified: Secondary | ICD-10-CM | POA: Diagnosis not present

## 2019-06-04 DIAGNOSIS — F4310 Post-traumatic stress disorder, unspecified: Secondary | ICD-10-CM | POA: Diagnosis not present

## 2019-06-08 ENCOUNTER — Other Ambulatory Visit: Payer: Self-pay | Admitting: Family Medicine

## 2019-06-08 DIAGNOSIS — R7303 Prediabetes: Secondary | ICD-10-CM

## 2019-06-08 DIAGNOSIS — E282 Polycystic ovarian syndrome: Secondary | ICD-10-CM

## 2019-06-10 ENCOUNTER — Encounter: Payer: Self-pay | Admitting: Physician Assistant

## 2019-06-10 ENCOUNTER — Ambulatory Visit (INDEPENDENT_AMBULATORY_CARE_PROVIDER_SITE_OTHER): Payer: Medicaid Other | Admitting: Physician Assistant

## 2019-06-10 DIAGNOSIS — Z20828 Contact with and (suspected) exposure to other viral communicable diseases: Secondary | ICD-10-CM | POA: Diagnosis not present

## 2019-06-10 DIAGNOSIS — Z20822 Contact with and (suspected) exposure to covid-19: Secondary | ICD-10-CM

## 2019-06-10 NOTE — Progress Notes (Signed)
      Telephone visit  Subjective: [id number] exposure PCP: No primary care provider on file. Kathleen Blake is a 48 y.o. female calls for telephone consult today. Patient provides verbal consent for consult held via phone.  Patient is identified with 2 separate identifiers.  At this time the entire area is on COVID-19 social distancing and stay home orders are in place.  Patient is of higher risk and therefore we are performing this by a virtual method.  Location of patient: Home Location of provider: [facility] Others present for call: No    This patient has had a covert exposure through her church.  She notes that she has been around some with a positive test.  So therefore we have discussed that she needs to quarantine for 14 days.  She will go have a test if she begins to have symptoms.  But at this time she is in the need for 14-day quarantine in that she could be negative all the way up until day 14 and then turn positive.  And she understands.  A letter has been written and she can take it to her work.  If they have any questions they can let us know.   ROS: Per HPI  No Known Allergies Past Medical History:  Diagnosis Date  . Anxiety    no meds while preg.  Marland Kitchen Epilepsy ([place])   . Epilepsy ([place])   . Hypertension   . Polycystic disease, ovaries   . Seizures ([place])    March 2013    Current Outpatient Medications:  .  albuterol (VENTOLIN HFA) 108 (90 Base) MCG/ACT inhaler, Inhale 2 puffs into the lungs every 6 (six) hours as needed for wheezing or shortness of breath., Disp: 18 g, Rfl: 2 .  fluticasone (FLONASE) 50 MCG/ACT nasal spray, Place 2 sprays into both nostrils daily., Disp: 16 g, Rfl: 11 .  hydrochlorothiazide (MICROZIDE) 12.5 MG capsule, Take 1 capsule (12.5 mg total) by mouth daily., Disp: 90 capsule, Rfl: 0 .  hydrOXYzine (ATARAX/VISTARIL) 25 MG tablet, Take 1 tablet (25 mg total) by mouth 3 (three) times daily as needed., Disp: 60 tablet, Rfl: 1 .  lamoTRIgine  (LAMICTAL) 100 MG tablet, Take 1 tablet (100 mg total) by mouth at bedtime., Disp: 90 tablet, Rfl: 0 .  metFORMIN (GLUCOPHAGE) 1000 MG tablet, TAKE 1 TABLET BY MOUTH TWICE DAILY WITH A MEAL, Disp: 180 tablet, Rfl: 0 .  metoprolol succinate (TOPROL-XL) 25 MG 24 hr tablet, Take 1 tablet by mouth once daily, Disp: 90 tablet, Rfl: 0 .  OXcarbazepine (TRILEPTAL) 150 MG tablet, Take 450 mg by mouth at bedtime., Disp: , Rfl:  .  sertraline (ZOLOFT) 50 MG tablet, Take 1 tablet (50 mg total) by mouth daily., Disp: 90 tablet, Rfl: 0  Assessment/ Plan: 48 y.o. female   .Exposure to Covid-19 Virus  Instructions for quarantine for the next 14 days are given to the patient.  A letter will be written for her to take to her work.  No follow-ups on file.  Continue all other maintenance medications as listed above.  Start time: 5:18 PM End time: 5:29 PM  No orders of the defined types were placed in this encounter.   Particia Nearing PA-C [place] [phone]

## 2019-06-11 DIAGNOSIS — F319 Bipolar disorder, unspecified: Secondary | ICD-10-CM | POA: Diagnosis not present

## 2019-06-11 DIAGNOSIS — F4310 Post-traumatic stress disorder, unspecified: Secondary | ICD-10-CM | POA: Diagnosis not present

## 2019-06-12 ENCOUNTER — Encounter: Payer: Self-pay | Admitting: Physician Assistant

## 2019-06-18 DIAGNOSIS — F4310 Post-traumatic stress disorder, unspecified: Secondary | ICD-10-CM | POA: Diagnosis not present

## 2019-06-18 DIAGNOSIS — F319 Bipolar disorder, unspecified: Secondary | ICD-10-CM | POA: Diagnosis not present

## 2019-06-26 IMAGING — CT CT HEAD W/O CM
3 series · 15 of 47 positions shown, 18 images · non-contrast
Comparison: Brain CT 07/06/2004

CLINICAL DATA: Posterior headache.

EXAM:
CT HEAD WITHOUT CONTRAST
TECHNIQUE: Contiguous axial images were obtained from the base of the skull
through the vertex without intravenous contrast.

[Series 3: head 5.0 h30s · axial · 0.43mm/px · z∈[-78,+52]mm · 9 of 32 slices shown, 12 images]
[im 3/32  brain]
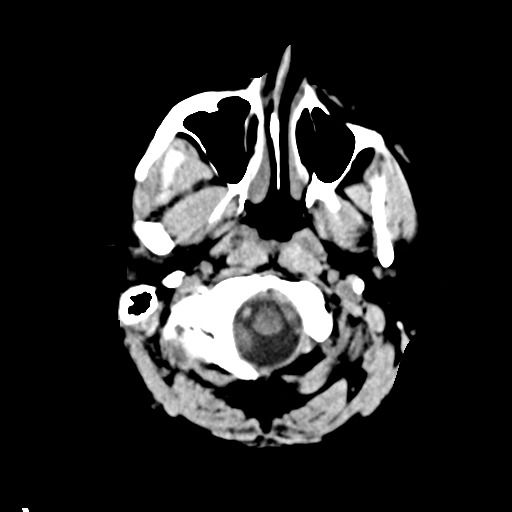
[im 3/32  bone]
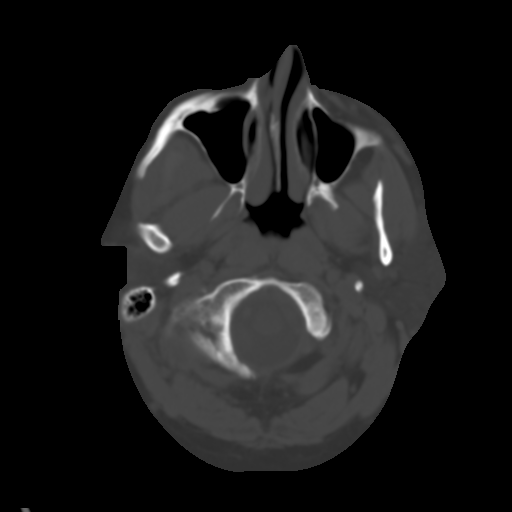
[im 6/32  brain]
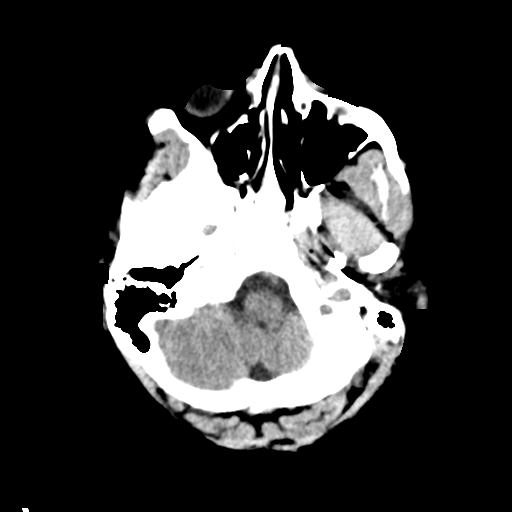
[im 9/32  brain]
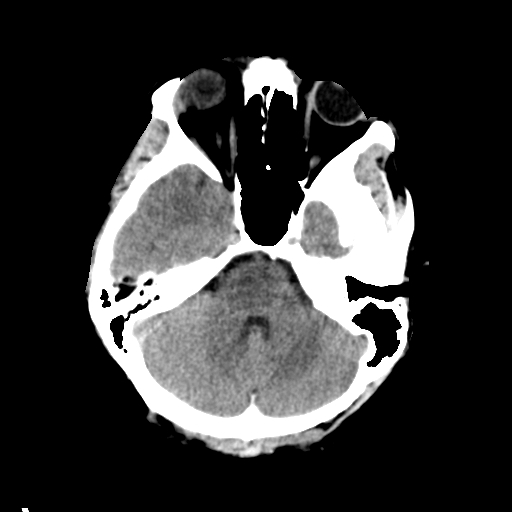
[im 12/32  brain]
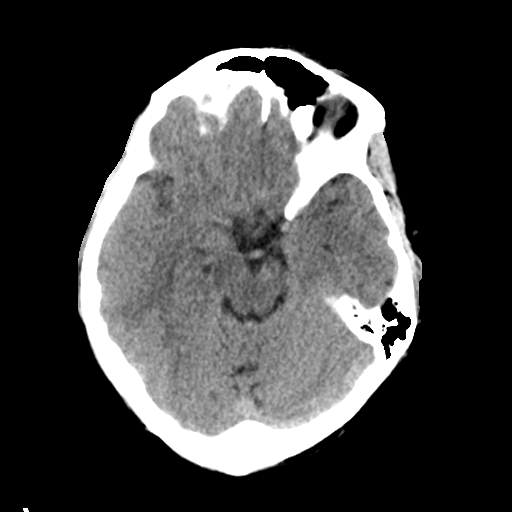
[im 17/32  brain]
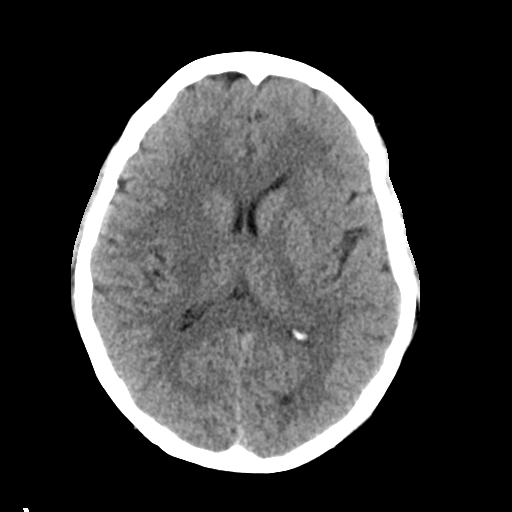
[im 17/32  bone]
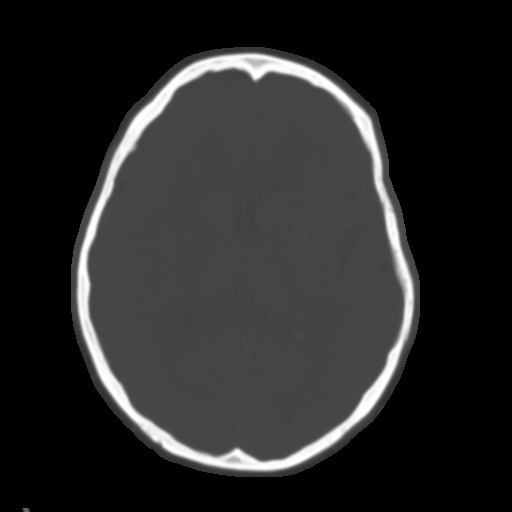
[im 20/32  brain]
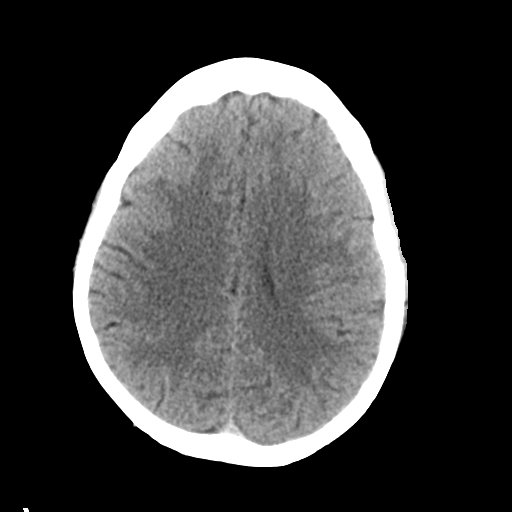
[im 23/32  brain]
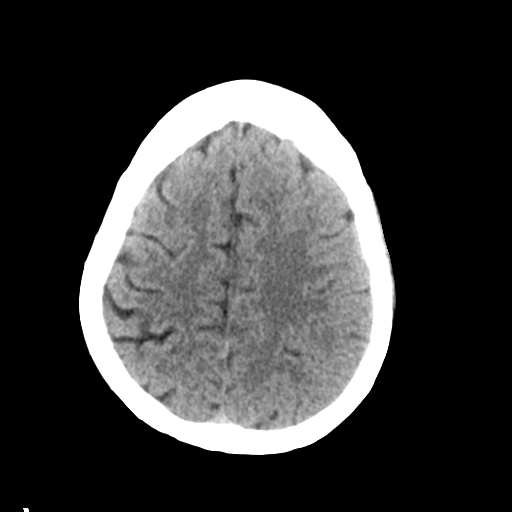
[im 26/32  brain]
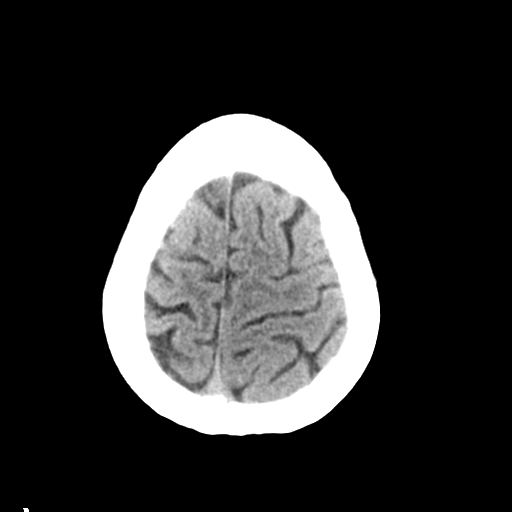
[im 29/32  brain]
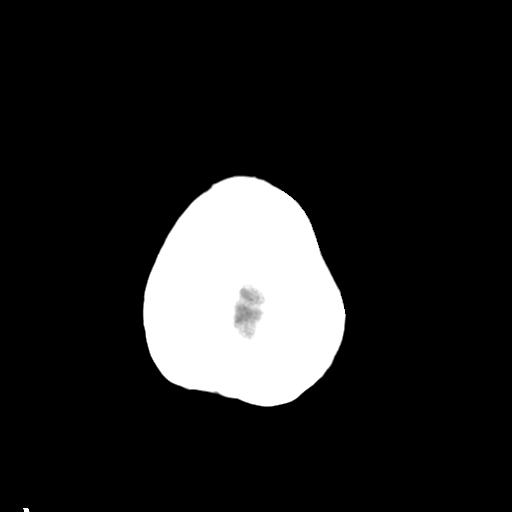
[im 29/32  bone]
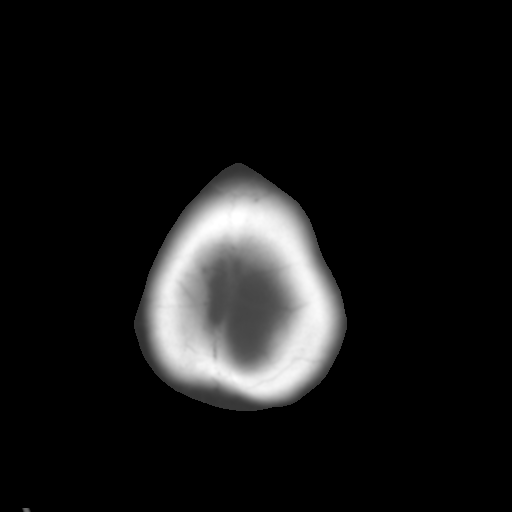

[Series 5: head 3.0 mpr cor · coronal · 0.31mm/px · 3 of 67 slices shown]
[im 23/67  brain]
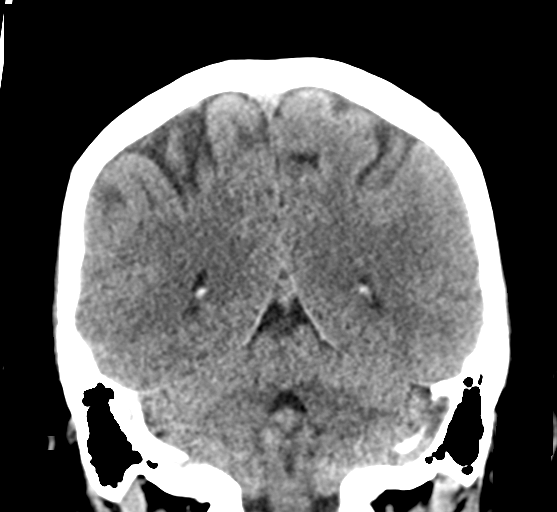
[im 30/67  brain]
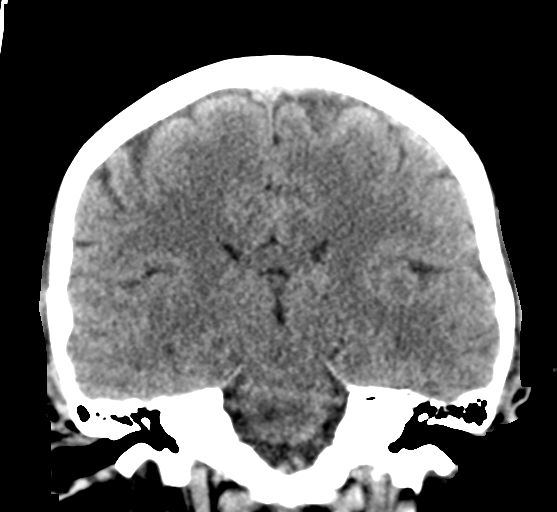
[im 37/67  brain]
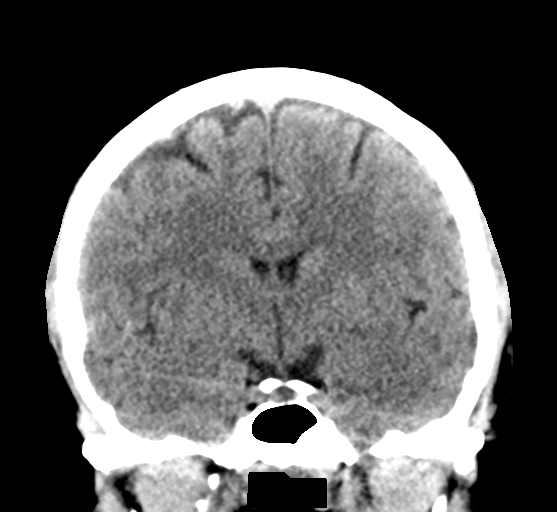

[Series 6: head 3.0 mpr sag · sagittal · 0.31mm/px · 3 of 67 slices shown]
[im 27/67  brain]
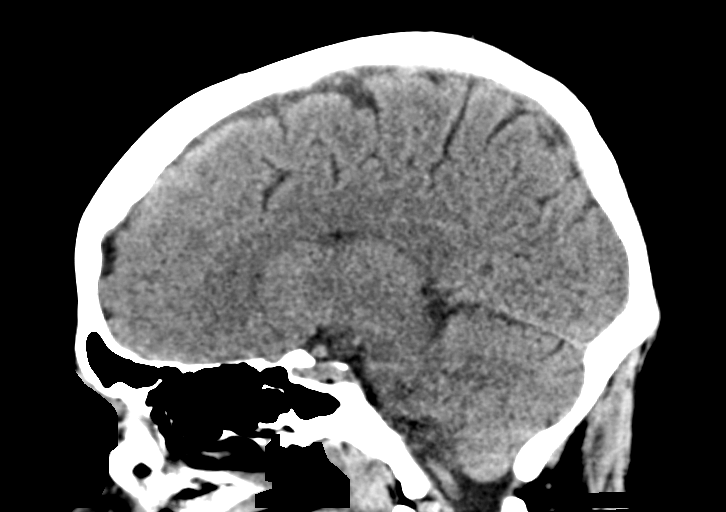
[im 34/67  brain]
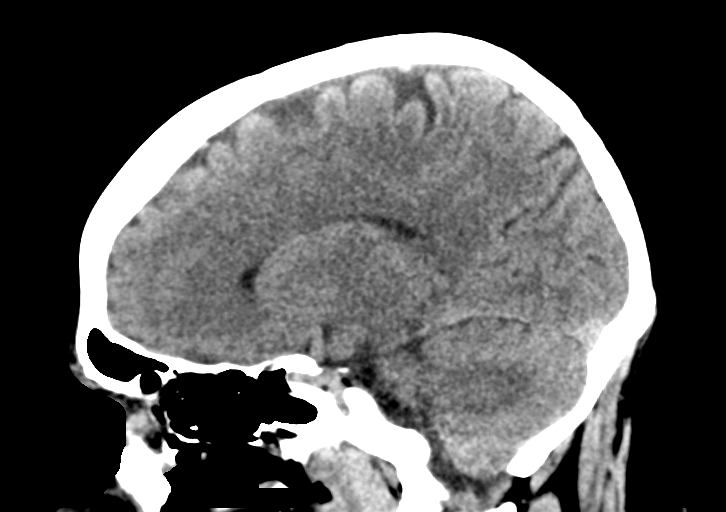
[im 41/67  brain]
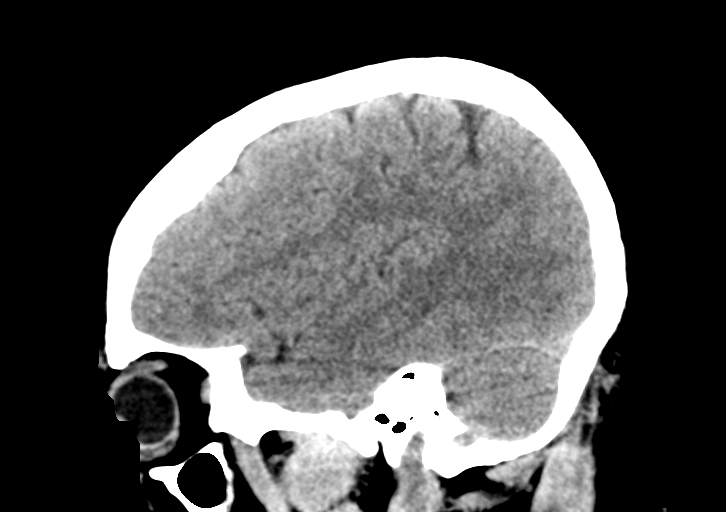

[15 of 47 positions shown; findings below may reference images not displayed]

FINDINGS: Brain: Ventricles and sulci are appropriate for patient's age. No
evidence for acute cortically based infarct, intracranial
hemorrhage, mass lesion or mass-effect.

Vascular: No hyperdense vessel or unexpected calcification.

Skull: Normal. Negative for fracture or focal lesion.

Sinuses/Orbits: Paranasal sinuses are well aerated. Mastoid air
cells are unremarkable. Orbits are unremarkable.

Other: None.
IMPRESSION: No acute intracranial process.

## 2019-07-02 DIAGNOSIS — F4310 Post-traumatic stress disorder, unspecified: Secondary | ICD-10-CM | POA: Diagnosis not present

## 2019-07-02 DIAGNOSIS — F319 Bipolar disorder, unspecified: Secondary | ICD-10-CM | POA: Diagnosis not present

## 2019-07-09 DIAGNOSIS — F4310 Post-traumatic stress disorder, unspecified: Secondary | ICD-10-CM | POA: Diagnosis not present

## 2019-07-09 DIAGNOSIS — F319 Bipolar disorder, unspecified: Secondary | ICD-10-CM | POA: Diagnosis not present

## 2019-07-15 IMAGING — DX DG CHEST 2V
2 series · 2 of 2 positions shown · non-contrast
Comparison: 08/02/2018, 07/21/2015

CLINICAL DATA: Cough and fever for 10 days

EXAM:
CHEST - 2 VIEW

[chest pa]
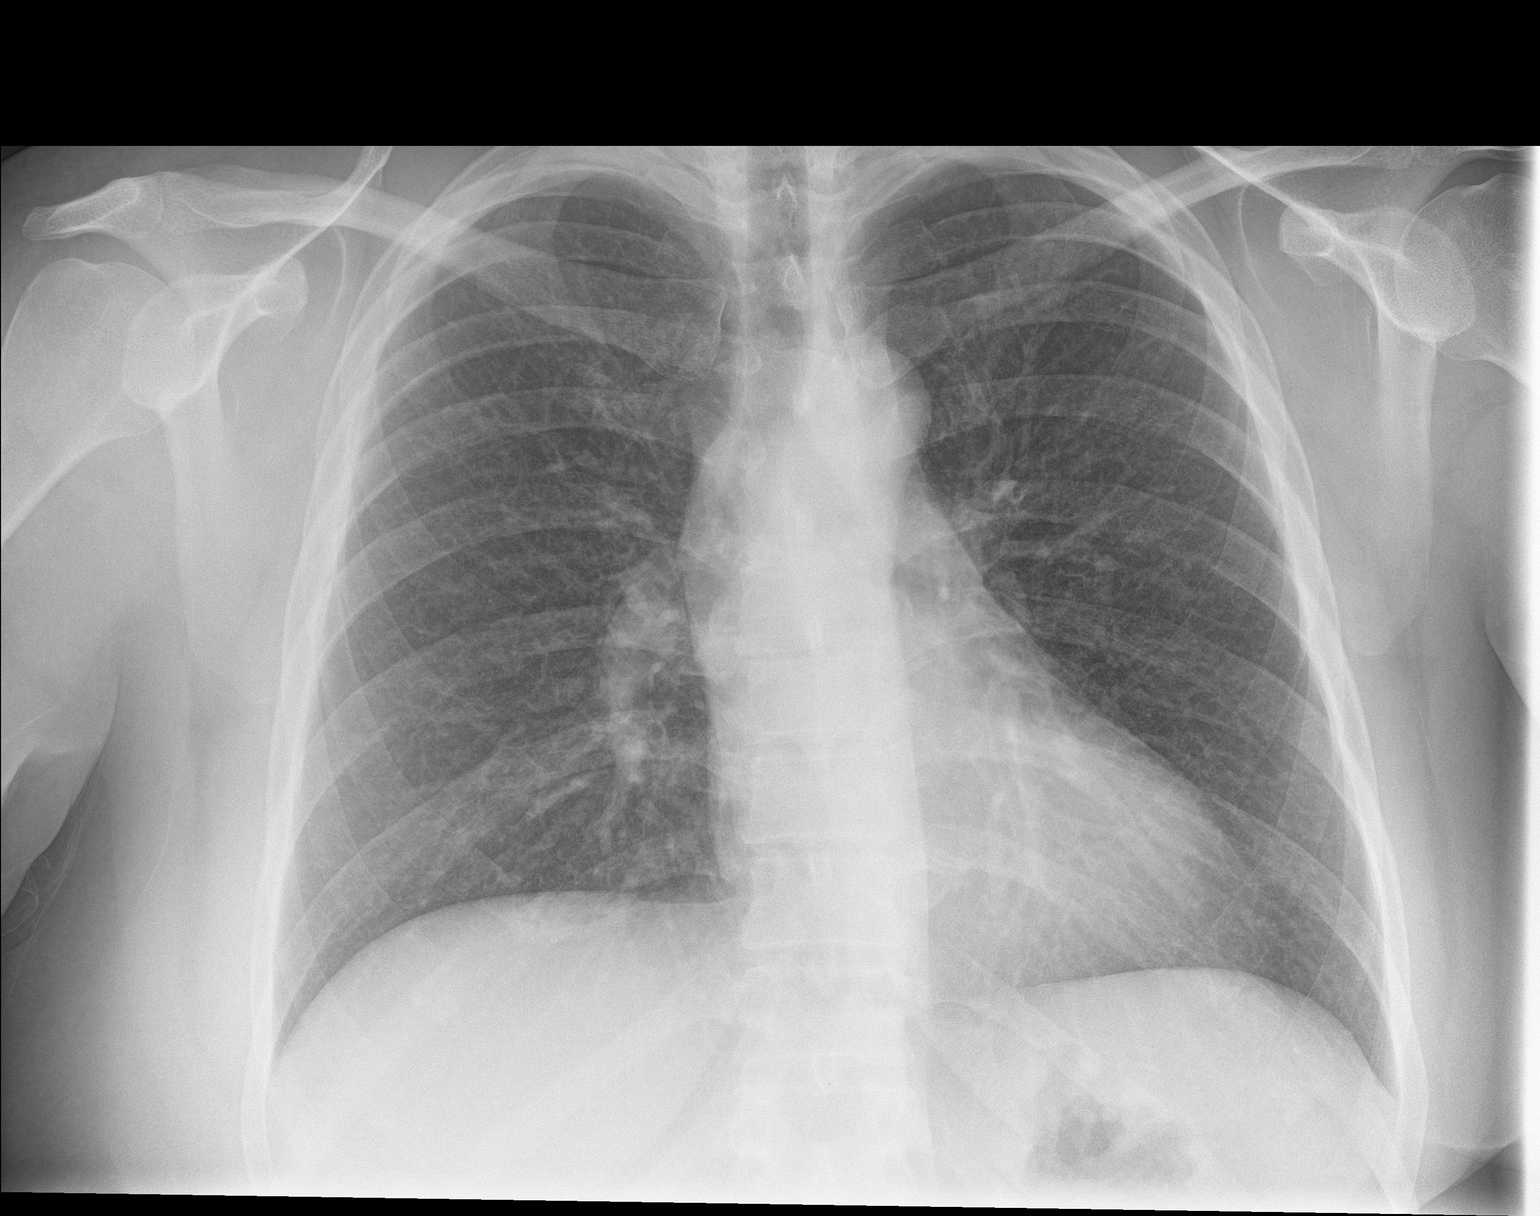

[chest lat]
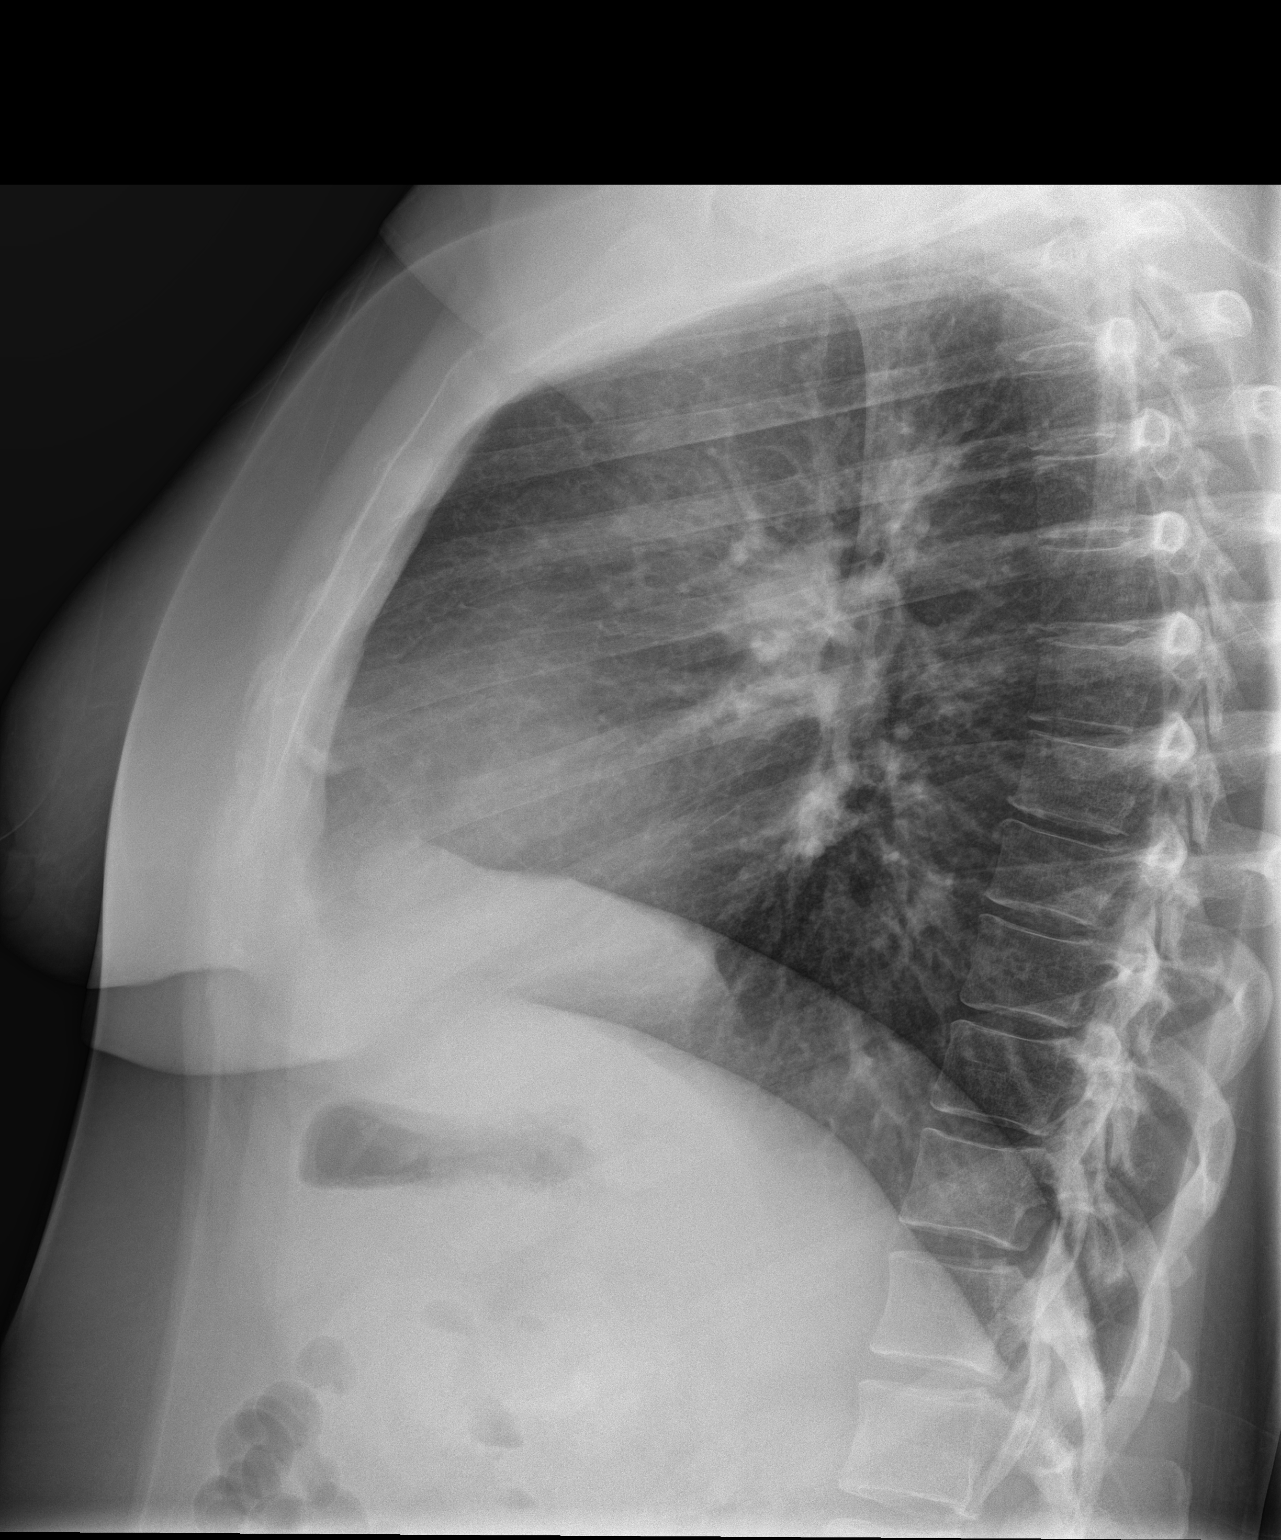

[2 of 2 positions shown; findings below may reference images not displayed]

FINDINGS: There is mild bilateral chronic interstitial thickening. There is no
focal parenchymal opacity. There is no pleural effusion or
pneumothorax. The heart and mediastinal contours are unremarkable.

The osseous structures are unremarkable.
IMPRESSION: No active cardiopulmonary disease.

## 2019-08-06 DIAGNOSIS — F319 Bipolar disorder, unspecified: Secondary | ICD-10-CM | POA: Diagnosis not present

## 2019-08-06 DIAGNOSIS — F4310 Post-traumatic stress disorder, unspecified: Secondary | ICD-10-CM | POA: Diagnosis not present

## 2019-08-13 DIAGNOSIS — F4310 Post-traumatic stress disorder, unspecified: Secondary | ICD-10-CM | POA: Diagnosis not present

## 2019-08-13 DIAGNOSIS — F319 Bipolar disorder, unspecified: Secondary | ICD-10-CM | POA: Diagnosis not present

## 2019-09-06 ENCOUNTER — Other Ambulatory Visit: Payer: Self-pay | Admitting: Family Medicine

## 2019-09-06 DIAGNOSIS — F321 Major depressive disorder, single episode, moderate: Secondary | ICD-10-CM

## 2019-09-06 DIAGNOSIS — R7303 Prediabetes: Secondary | ICD-10-CM

## 2019-09-06 DIAGNOSIS — I10 Essential (primary) hypertension: Secondary | ICD-10-CM

## 2019-09-06 DIAGNOSIS — E282 Polycystic ovarian syndrome: Secondary | ICD-10-CM

## 2019-09-06 DIAGNOSIS — F411 Generalized anxiety disorder: Secondary | ICD-10-CM

## 2019-09-09 MED ORDER — METOPROLOL SUCCINATE ER 25 MG PO TB24: 25.0000 mg | ORAL_TABLET | Freq: Every day | ORAL | 0 refills | Status: DC

## 2019-09-09 MED ORDER — METFORMIN HCL 1000 MG PO TABS: ORAL_TABLET | ORAL | 0 refills | Status: DC

## 2019-09-09 MED ORDER — LAMOTRIGINE 100 MG PO TABS: 100.0000 mg | ORAL_TABLET | Freq: Every day | ORAL | 0 refills | Status: DC

## 2019-09-09 MED ORDER — SERTRALINE HCL 50 MG PO TABS: 50.0000 mg | ORAL_TABLET | Freq: Every day | ORAL | 0 refills | Status: DC

## 2019-09-09 MED ORDER — HYDROCHLOROTHIAZIDE 12.5 MG PO CAPS: 12.5000 mg | ORAL_CAPSULE | Freq: Every day | ORAL | 0 refills | Status: DC

## 2019-09-24 DIAGNOSIS — F4310 Post-traumatic stress disorder, unspecified: Secondary | ICD-10-CM | POA: Diagnosis not present

## 2019-09-24 DIAGNOSIS — F319 Bipolar disorder, unspecified: Secondary | ICD-10-CM | POA: Diagnosis not present

## 2019-09-30 ENCOUNTER — Other Ambulatory Visit: Payer: Self-pay | Admitting: Family Medicine

## 2019-09-30 DIAGNOSIS — F321 Major depressive disorder, single episode, moderate: Secondary | ICD-10-CM

## 2019-09-30 DIAGNOSIS — F411 Generalized anxiety disorder: Secondary | ICD-10-CM

## 2019-10-01 DIAGNOSIS — F319 Bipolar disorder, unspecified: Secondary | ICD-10-CM | POA: Diagnosis not present

## 2019-10-01 DIAGNOSIS — F4310 Post-traumatic stress disorder, unspecified: Secondary | ICD-10-CM | POA: Diagnosis not present

## 2019-11-19 ENCOUNTER — Other Ambulatory Visit: Payer: Self-pay | Admitting: Family Medicine

## 2019-11-19 DIAGNOSIS — I10 Essential (primary) hypertension: Secondary | ICD-10-CM

## 2019-11-19 DIAGNOSIS — E282 Polycystic ovarian syndrome: Secondary | ICD-10-CM

## 2019-11-19 DIAGNOSIS — F321 Major depressive disorder, single episode, moderate: Secondary | ICD-10-CM

## 2019-11-19 DIAGNOSIS — R7303 Prediabetes: Secondary | ICD-10-CM

## 2019-11-19 DIAGNOSIS — F411 Generalized anxiety disorder: Secondary | ICD-10-CM

## 2019-11-20 ENCOUNTER — Other Ambulatory Visit: Payer: Self-pay | Admitting: Family Medicine

## 2019-11-20 ENCOUNTER — Other Ambulatory Visit: Payer: Self-pay | Admitting: Family

## 2019-11-20 DIAGNOSIS — F321 Major depressive disorder, single episode, moderate: Secondary | ICD-10-CM

## 2019-11-20 DIAGNOSIS — I10 Essential (primary) hypertension: Secondary | ICD-10-CM

## 2019-11-20 DIAGNOSIS — E282 Polycystic ovarian syndrome: Secondary | ICD-10-CM

## 2019-11-20 DIAGNOSIS — R7303 Prediabetes: Secondary | ICD-10-CM

## 2019-11-20 DIAGNOSIS — F411 Generalized anxiety disorder: Secondary | ICD-10-CM

## 2019-11-20 MED ORDER — BUDESONIDE 0.5 MG/2ML IN SUSP: 0.5000 mg | Freq: Three times a day (TID) | RESPIRATORY_TRACT | 12 refills | Status: DC | PRN

## 2019-11-20 MED ORDER — ALBUTEROL SULFATE HFA 108 (90 BASE) MCG/ACT IN AERS: 2.0000 | INHALATION_SPRAY | Freq: Four times a day (QID) | RESPIRATORY_TRACT | 2 refills | Status: DC | PRN

## 2019-11-20 NOTE — Telephone Encounter (Signed)
Lmtcb-cb 3/10

## 2019-11-20 NOTE — Telephone Encounter (Signed)
Last office visit 06/10/2019

## 2019-11-20 NOTE — Telephone Encounter (Signed)
Patient is requesting pulmicort and albuterol

## 2020-02-17 ENCOUNTER — Other Ambulatory Visit: Payer: Self-pay | Admitting: Family Medicine

## 2020-03-10 ENCOUNTER — Other Ambulatory Visit: Payer: Self-pay | Admitting: Family Medicine

## 2020-03-10 DIAGNOSIS — I10 Essential (primary) hypertension: Secondary | ICD-10-CM

## 2020-03-10 DIAGNOSIS — E282 Polycystic ovarian syndrome: Secondary | ICD-10-CM

## 2020-03-10 DIAGNOSIS — F321 Major depressive disorder, single episode, moderate: Secondary | ICD-10-CM

## 2020-03-10 DIAGNOSIS — F411 Generalized anxiety disorder: Secondary | ICD-10-CM

## 2020-03-10 DIAGNOSIS — R7303 Prediabetes: Secondary | ICD-10-CM

## 2020-03-10 MED ORDER — LAMOTRIGINE 100 MG PO TABS: 100.0000 mg | ORAL_TABLET | Freq: Every day | ORAL | 1 refills | Status: DC

## 2020-03-10 MED ORDER — HYDROCHLOROTHIAZIDE 12.5 MG PO CAPS: 12.5000 mg | ORAL_CAPSULE | Freq: Every day | ORAL | 1 refills | Status: DC

## 2020-03-10 MED ORDER — METOPROLOL SUCCINATE ER 25 MG PO TB24: 25.0000 mg | ORAL_TABLET | Freq: Every day | ORAL | 1 refills | Status: DC

## 2020-03-10 MED ORDER — SERTRALINE HCL 50 MG PO TABS: 50.0000 mg | ORAL_TABLET | Freq: Every day | ORAL | 1 refills | Status: DC

## 2020-03-10 MED ORDER — METFORMIN HCL 1000 MG PO TABS: ORAL_TABLET | ORAL | 1 refills | Status: DC

## 2020-03-10 NOTE — Telephone Encounter (Signed)
rf sent for pt

## 2020-06-08 ENCOUNTER — Other Ambulatory Visit: Payer: Self-pay | Admitting: *Deleted

## 2020-06-08 DIAGNOSIS — I10 Essential (primary) hypertension: Secondary | ICD-10-CM

## 2020-06-08 DIAGNOSIS — F411 Generalized anxiety disorder: Secondary | ICD-10-CM

## 2020-06-08 DIAGNOSIS — F321 Major depressive disorder, single episode, moderate: Secondary | ICD-10-CM

## 2020-06-08 DIAGNOSIS — R7303 Prediabetes: Secondary | ICD-10-CM

## 2020-06-08 DIAGNOSIS — E282 Polycystic ovarian syndrome: Secondary | ICD-10-CM

## 2020-06-20 ENCOUNTER — Encounter: Payer: Self-pay | Admitting: Family Medicine

## 2020-06-22 ENCOUNTER — Other Ambulatory Visit: Payer: Self-pay | Admitting: *Deleted

## 2020-06-22 DIAGNOSIS — F321 Major depressive disorder, single episode, moderate: Secondary | ICD-10-CM

## 2020-06-22 DIAGNOSIS — I10 Essential (primary) hypertension: Secondary | ICD-10-CM

## 2020-06-22 DIAGNOSIS — E282 Polycystic ovarian syndrome: Secondary | ICD-10-CM

## 2020-06-22 DIAGNOSIS — R7303 Prediabetes: Secondary | ICD-10-CM

## 2020-06-22 DIAGNOSIS — F411 Generalized anxiety disorder: Secondary | ICD-10-CM

## 2020-06-22 DIAGNOSIS — J301 Allergic rhinitis due to pollen: Secondary | ICD-10-CM

## 2020-06-22 MED ORDER — FLUTICASONE PROPIONATE 50 MCG/ACT NA SUSP: 2.0000 | Freq: Every day | NASAL | 1 refills | Status: AC

## 2020-06-22 MED ORDER — SERTRALINE HCL 50 MG PO TABS: 50.0000 mg | ORAL_TABLET | Freq: Every day | ORAL | 1 refills | Status: AC

## 2020-06-22 MED ORDER — HYDROCHLOROTHIAZIDE 12.5 MG PO CAPS: 12.5000 mg | ORAL_CAPSULE | Freq: Every day | ORAL | 1 refills | Status: AC

## 2020-06-22 MED ORDER — METFORMIN HCL 1000 MG PO TABS: ORAL_TABLET | ORAL | 1 refills | Status: AC

## 2020-06-22 MED ORDER — LAMOTRIGINE 100 MG PO TABS: 100.0000 mg | ORAL_TABLET | Freq: Every day | ORAL | 1 refills | Status: AC

## 2020-06-22 MED ORDER — ALBUTEROL SULFATE HFA 108 (90 BASE) MCG/ACT IN AERS: 2.0000 | INHALATION_SPRAY | Freq: Four times a day (QID) | RESPIRATORY_TRACT | 2 refills | Status: DC | PRN

## 2020-06-22 MED ORDER — METOPROLOL SUCCINATE ER 25 MG PO TB24: 25.0000 mg | ORAL_TABLET | Freq: Every day | ORAL | 1 refills | Status: AC

## 2020-06-22 MED ORDER — BUDESONIDE 0.5 MG/2ML IN SUSP: 0.5000 mg | Freq: Three times a day (TID) | RESPIRATORY_TRACT | 3 refills | Status: AC | PRN

## 2020-06-24 ENCOUNTER — Telehealth: Payer: Self-pay

## 2020-06-24 NOTE — Telephone Encounter (Signed)
Received fax from [facility] pharmacy stating that Alvuterol sulfate HFA (Ventolin) 90 mcg is no longer covered by patients insurance. They are recommending ProAir HFA brand. Do you want to change or proceed with the PA?

## 2020-06-25 MED ORDER — PROAIR HFA 108 (90 BASE) MCG/ACT IN AERS: 1.0000 | INHALATION_SPRAY | Freq: Four times a day (QID) | RESPIRATORY_TRACT | 5 refills | Status: DC | PRN

## 2020-06-25 MED ORDER — PROAIR HFA 108 (90 BASE) MCG/ACT IN AERS: 1.0000 | INHALATION_SPRAY | Freq: Four times a day (QID) | RESPIRATORY_TRACT | 5 refills | Status: AC | PRN

## 2020-06-25 NOTE — Telephone Encounter (Signed)
Sent ProAir for the patient 

## 2020-09-29 ENCOUNTER — Other Ambulatory Visit: Payer: Self-pay | Admitting: Family Medicine

## 2020-09-29 DIAGNOSIS — F321 Major depressive disorder, single episode, moderate: Secondary | ICD-10-CM

## 2020-09-29 DIAGNOSIS — F411 Generalized anxiety disorder: Secondary | ICD-10-CM

## 2020-10-02 ENCOUNTER — Other Ambulatory Visit: Payer: Self-pay | Admitting: Family Medicine

## 2020-10-05 NOTE — Addendum Note (Signed)
Addended by: Julious Payer D on: 10/05/2020 10:18 AM   Modules accepted: Orders

## 2020-10-06 ENCOUNTER — Telehealth: Payer: Self-pay

## 2020-10-06 NOTE — Telephone Encounter (Signed)
  Prescription Request  10/06/2020  What is the name of the medication or equipment? metoprolol succinate (TOPROL-XL) 25 MG 24 hr tablet   Have you contacted your pharmacy to request a refill? (if applicable) yes  Which pharmacy would you like this sent to? [facility]   Patient notified that their request is being sent to the clinical staff for review and that they should receive a response within 2 business days.

## 2020-10-12 NOTE — Telephone Encounter (Signed)
Refills denied. Last OV 03/04/19

## 2020-10-28 ENCOUNTER — Other Ambulatory Visit: Payer: Self-pay | Admitting: Family Medicine

## 2020-10-28 DIAGNOSIS — F321 Major depressive disorder, single episode, moderate: Secondary | ICD-10-CM

## 2020-10-28 DIAGNOSIS — F411 Generalized anxiety disorder: Secondary | ICD-10-CM

## 2020-12-03 ENCOUNTER — Other Ambulatory Visit: Payer: Self-pay | Admitting: Family Medicine

## 2020-12-03 DIAGNOSIS — F411 Generalized anxiety disorder: Secondary | ICD-10-CM

## 2020-12-03 DIAGNOSIS — F321 Major depressive disorder, single episode, moderate: Secondary | ICD-10-CM

## 2021-02-12 ENCOUNTER — Other Ambulatory Visit: Payer: Self-pay | Admitting: Family Medicine

## 2021-02-12 DIAGNOSIS — I10 Essential (primary) hypertension: Secondary | ICD-10-CM

## 2021-03-15 ENCOUNTER — Other Ambulatory Visit: Payer: Self-pay | Admitting: Family Medicine

## 2021-03-15 DIAGNOSIS — F411 Generalized anxiety disorder: Secondary | ICD-10-CM

## 2021-03-15 DIAGNOSIS — F321 Major depressive disorder, single episode, moderate: Secondary | ICD-10-CM

## 2021-06-21 ENCOUNTER — Other Ambulatory Visit: Payer: Self-pay | Admitting: Family Medicine

## 2021-06-21 DIAGNOSIS — I10 Essential (primary) hypertension: Secondary | ICD-10-CM

## 2021-11-23 ENCOUNTER — Other Ambulatory Visit: Payer: Self-pay | Admitting: Family Medicine

## 2021-11-23 DIAGNOSIS — F321 Major depressive disorder, single episode, moderate: Secondary | ICD-10-CM

## 2021-11-23 DIAGNOSIS — F411 Generalized anxiety disorder: Secondary | ICD-10-CM
